# Patient Record
Sex: Female | Born: 1975 | Race: White | Hispanic: No | Marital: Married | Smoking: Former smoker
Health system: Southern US, Community
[De-identification: ages and names within clinical notes are randomized; demographics above are authoritative.]

## PROBLEM LIST (undated history)

## (undated) DIAGNOSIS — F32A Depression, unspecified: Secondary | ICD-10-CM

## (undated) DIAGNOSIS — R519 Headache, unspecified: Secondary | ICD-10-CM

## (undated) DIAGNOSIS — F419 Anxiety disorder, unspecified: Secondary | ICD-10-CM

## (undated) DIAGNOSIS — N200 Calculus of kidney: Secondary | ICD-10-CM

## (undated) DIAGNOSIS — K219 Gastro-esophageal reflux disease without esophagitis: Secondary | ICD-10-CM

## (undated) DIAGNOSIS — E785 Hyperlipidemia, unspecified: Secondary | ICD-10-CM

## (undated) DIAGNOSIS — J45909 Unspecified asthma, uncomplicated: Secondary | ICD-10-CM

## (undated) DIAGNOSIS — Z87442 Personal history of urinary calculi: Secondary | ICD-10-CM

## (undated) HISTORY — PX: WISDOM TOOTH EXTRACTION: SHX21

## (undated) HISTORY — DX: Hyperlipidemia, unspecified: E78.5

## (undated) HISTORY — PX: ABDOMINAL HYSTERECTOMY: SHX81

## (undated) HISTORY — PX: OTHER SURGICAL HISTORY: SHX169

## (undated) HISTORY — PX: TONSILLECTOMY: SUR1361

## (undated) HISTORY — DX: Unspecified asthma, uncomplicated: J45.909

## (undated) HISTORY — PX: APPENDECTOMY: SHX54

## (undated) HISTORY — DX: Anxiety disorder, unspecified: F41.9

## (undated) HISTORY — DX: Calculus of kidney: N20.0

## (undated) HISTORY — PX: ECTOPIC PREGNANCY SURGERY: SHX613

---

## 2021-01-11 LAB — HM MAMMOGRAPHY

## 2021-01-25 DIAGNOSIS — R5383 Other fatigue: Secondary | ICD-10-CM | POA: Insufficient documentation

## 2021-02-28 ENCOUNTER — Other Ambulatory Visit: Payer: Self-pay

## 2021-02-28 ENCOUNTER — Ambulatory Visit (INDEPENDENT_AMBULATORY_CARE_PROVIDER_SITE_OTHER): Payer: Managed Care, Other (non HMO) | Admitting: Family Medicine

## 2021-02-28 ENCOUNTER — Encounter: Payer: Self-pay | Admitting: Family Medicine

## 2021-02-28 VITALS — BP 110/78 | HR 96 | Temp 97.4°F | Resp 18 | Ht 64.0 in | Wt 221.0 lb

## 2021-02-28 DIAGNOSIS — M791 Myalgia, unspecified site: Secondary | ICD-10-CM

## 2021-02-28 DIAGNOSIS — G8929 Other chronic pain: Secondary | ICD-10-CM

## 2021-02-28 DIAGNOSIS — R519 Headache, unspecified: Secondary | ICD-10-CM

## 2021-02-28 DIAGNOSIS — F411 Generalized anxiety disorder: Secondary | ICD-10-CM

## 2021-02-28 DIAGNOSIS — M2559 Pain in other specified joint: Secondary | ICD-10-CM

## 2021-02-28 DIAGNOSIS — E782 Mixed hyperlipidemia: Secondary | ICD-10-CM

## 2021-02-28 DIAGNOSIS — R5383 Other fatigue: Secondary | ICD-10-CM

## 2021-02-28 NOTE — Progress Notes (Signed)
Subjective:  Patient ID: Kimberly Cabrera, female    DOB: 1975-10-30  Age: 45 y.o. MRN: [id number]  Chief Complaint  Patient presents with   New Patient (Initial Visit)    HPI Patient works for a [employer].) Has a lot of animals (rescue.) Lives in [place].  Hyperlipidemia: Has not had checked in a while.  Asthma: Seasonal. Not truly diagnosed.  History of kidney stones.  Has anxiety.   Pt was bitten by a tick last year. Pt was seen at urgent care 2 months after bite.  Pt began having fatigue, joint pain.  Positive for RMSF IGG. Given doxycycline x 14 days. Had a left anterior lymph node. Pt saw ENT at [facility]. She ordered an ultrasound. She saw Dr. Marvis Moeller. He ordered ct of neck, chest, abdomen, pelvis. Lymph nodes were all normal.   Now complaining of fatigue, joint pain, muscle pain, headaches. Waxes and wanes, but overall worsens. Tick bite was in left parietal region and area is still sore. Scheduled to see Rheumatology in [facility].    Current Outpatient Medications on File Prior to Visit  Medication Sig Dispense Refill   Aspirin-Acetaminophen (GOODYS BODY PAIN PO) Take by mouth in the morning and at bedtime.     FLUoxetine (PROZAC) 40 MG capsule Take by mouth.     LORazepam (ATIVAN) 0.5 MG tablet Take by mouth.     ondansetron (ZOFRAN-ODT) 4 MG disintegrating tablet Take by mouth.     No current facility-administered medications on file prior to visit.   Past Medical History:  Diagnosis Date   Anxiety    Asthma    Hyperlipidemia    Kidney stones    Past Surgical History:  Procedure Laterality Date   ABDOMINAL HYSTERECTOMY     45 yo. Total.   APPENDECTOMY     ECTOPIC PREGNANCY SURGERY N/A     Family History  Problem Relation Age of Onset   Hyperlipidemia Mother    Diabetes Mother    Hyperlipidemia Father    Diabetes Father    Heart disease Father    Dementia Father    Melanoma Father    Melanoma Sister    Diabetes Sister     Hypertension Maternal Grandmother    Mental illness Maternal Grandmother    Asthma Paternal Grandfather    Heart attack Paternal Grandfather    Arthritis Paternal Grandfather    Social History   Socioeconomic History   Marital status: Married    Spouse name: Not on file   Number of children: Not on file   Years of education: Not on file   Highest education level: Not on file  Occupational History   Not on file  Tobacco Use   Smoking status: Every Day    Packs/day: 0.50    Years: 5.00    Pack years: 2.50    Types: Cigarettes   Smokeless tobacco: Never  Substance and Sexual Activity   Alcohol use: Never   Drug use: Never   Sexual activity: Not on file  Other Topics Concern   Not on file  Social History Narrative   Not on file   Social Determinants of Health   Financial Resource Strain: Not on file  Food Insecurity: Not on file  Transportation Needs: Not on file  Physical Activity: Not on file  Stress: Not on file  Social Connections: Not on file    Review of Systems  Constitutional:  Positive for fatigue. Negative for chills and fever.  Night sweats   HENT:  Negative for congestion, ear pain, rhinorrhea and sore throat.   Respiratory:  Negative for cough and shortness of breath.   Cardiovascular:  Positive for chest pain (gets a pain mid chest for a second. no radiation. no dyspnea, nausea, vomiting, diaphoresis.).  Gastrointestinal:  Positive for abdominal pain (epigastric. 2-3 goodies per day.). Negative for constipation, diarrhea, nausea and vomiting.  Endocrine: Negative for polydipsia and polyphagia.  Genitourinary:  Negative for dysuria and urgency.  Musculoskeletal:  Positive for arthralgias (diffuse pain), back pain and myalgias.  Neurological:  Positive for headaches (3-4 times per week. Goodies help.). Negative for dizziness, weakness and light-headedness.  Psychiatric/Behavioral:  Positive for sleep disturbance (awakens at 3 m.). Negative for  dysphoric mood. The patient is nervous/anxious.     Objective:  BP 110/78   Pulse 96   Temp (!) 97.4 F (36.3 C)   Resp 18   Ht 5\' 4"  (1.626 m)   Wt 221 lb (100.2 kg)   BMI 37.93 kg/m   BP/Weight 02/28/2021  Systolic BP 110  Diastolic BP 78  Wt. (Lbs) 221  BMI 37.93    Physical Exam Vitals reviewed.  Constitutional:      General: She is not in acute distress.    Appearance: Normal appearance. She is normal weight.  HENT:     Right Ear: Tympanic membrane and ear canal normal.     Left Ear: Tympanic membrane and ear canal normal.     Nose: Nose normal. No congestion or rhinorrhea.  Neck:     Thyroid: No thyroid mass.     Vascular: No carotid bruit.  Cardiovascular:     Rate and Rhythm: Normal rate and regular rhythm.     Pulses: Normal pulses.     Heart sounds: Normal heart sounds. No murmur heard. Pulmonary:     Effort: Pulmonary effort is normal.     Breath sounds: Normal breath sounds.  Abdominal:     General: Bowel sounds are normal.     Palpations: Abdomen is soft. There is no mass.     Tenderness: There is no abdominal tenderness.  Musculoskeletal:        General: Normal range of motion.  Lymphadenopathy:     Cervical: No cervical adenopathy.  Skin:    General: Skin is warm and dry.  Neurological:     Mental Status: She is alert and oriented to person, place, and time.     Cranial Nerves: No cranial nerve deficit.  Psychiatric:        Mood and Affect: Mood normal.        Behavior: Behavior normal.    Diabetic Foot Exam - Simple   No data filed      No results found for: WBC, HGB, HCT, PLT, GLUCOSE, CHOL, TRIG, HDL, LDLDIRECT, LDLCALC, ALT, AST, NA, K, CL, CREATININE, BUN, CO2, TSH, PSA, INR, GLUF, HGBA1C, MICROALBUR    Assessment & Plan:   Problem List Items Addressed This Visit       Other   Fatigue - Primary    Await records and await rheumatology visit.      Joint pain    Await records and await rheumatology visit.      Muscle  pain    Await records and await rheumatology visit.      Chronic nonintractable headache    Await records and await rheumatology visit.      Relevant Medications   Aspirin-Acetaminophen (GOODYS BODY PAIN PO)  FLUoxetine (PROZAC) 40 MG capsule   Mixed hyperlipidemia    Follow up in 3 weeks fasting cpe.      GAD (generalized anxiety disorder)    Continue prozac 40 mg once daily and lorazepam 0.5 mg once daily prn anxiety.      Relevant Medications   FLUoxetine (PROZAC) 40 MG capsule   LORazepam (ATIVAN) 0.5 MG tablet  .  No orders of the defined types were placed in this encounter.   No orders of the defined types were placed in this encounter.    Follow-up: Return in about 3 weeks (around 03/21/2021) for chronic fasting.  An After Visit Summary was printed and given to the patient.  Blane Ohara, MD [facility] [phone]

## 2021-03-01 ENCOUNTER — Encounter: Payer: Self-pay | Admitting: Family Medicine

## 2021-03-04 ENCOUNTER — Other Ambulatory Visit: Payer: Self-pay | Admitting: Family Medicine

## 2021-03-04 DIAGNOSIS — R5383 Other fatigue: Secondary | ICD-10-CM

## 2021-03-04 DIAGNOSIS — M2559 Pain in other specified joint: Secondary | ICD-10-CM

## 2021-03-04 DIAGNOSIS — R519 Headache, unspecified: Secondary | ICD-10-CM

## 2021-03-04 DIAGNOSIS — M791 Myalgia, unspecified site: Secondary | ICD-10-CM

## 2021-03-11 ENCOUNTER — Encounter: Payer: Self-pay | Admitting: Family Medicine

## 2021-03-11 DIAGNOSIS — M791 Myalgia, unspecified site: Secondary | ICD-10-CM | POA: Insufficient documentation

## 2021-03-11 DIAGNOSIS — M2550 Pain in unspecified joint: Secondary | ICD-10-CM | POA: Insufficient documentation

## 2021-03-11 DIAGNOSIS — R519 Headache, unspecified: Secondary | ICD-10-CM | POA: Insufficient documentation

## 2021-03-11 DIAGNOSIS — G8929 Other chronic pain: Secondary | ICD-10-CM | POA: Insufficient documentation

## 2021-03-12 ENCOUNTER — Encounter: Payer: Self-pay | Admitting: Family Medicine

## 2021-03-18 ENCOUNTER — Encounter: Payer: Self-pay | Admitting: Family Medicine

## 2021-03-18 DIAGNOSIS — E1169 Type 2 diabetes mellitus with other specified complication: Secondary | ICD-10-CM | POA: Insufficient documentation

## 2021-03-18 DIAGNOSIS — E782 Mixed hyperlipidemia: Secondary | ICD-10-CM | POA: Insufficient documentation

## 2021-03-18 DIAGNOSIS — F411 Generalized anxiety disorder: Secondary | ICD-10-CM | POA: Insufficient documentation

## 2021-03-18 DIAGNOSIS — E1165 Type 2 diabetes mellitus with hyperglycemia: Secondary | ICD-10-CM | POA: Insufficient documentation

## 2021-03-18 NOTE — Assessment & Plan Note (Signed)
Await records and await rheumatology visit. 

## 2021-03-18 NOTE — Assessment & Plan Note (Signed)
Follow up in 3 weeks fasting cpe.

## 2021-03-18 NOTE — Assessment & Plan Note (Signed)
Continue prozac 40 mg once daily and lorazepam 0.5 mg once daily prn anxiety.

## 2021-03-18 NOTE — Assessment & Plan Note (Signed)
Await records and await rheumatology visit.

## 2021-03-18 NOTE — Assessment & Plan Note (Signed)
>>  ASSESSMENT AND PLAN FOR MIXED HYPERLIPIDEMIA WRITTEN ON 03/18/2021 10:26 PM BY Chiante Peden, MD  Follow up in 3 weeks fasting cpe.

## 2021-03-20 NOTE — Progress Notes (Signed)
Subjective:  Patient ID: Kimberly Cabrera, female    DOB: [birth year]  Age: 45 y.o. MRN: [id number]  Chief Complaint  Patient presents with   Annual Exam    HPI Kimberly Cabrera is a 45 year old Caucasian female that presents for annual physical exam Encounter for general adult medical examination without abnormal findings  Physical ("At Risk" items are starred): Patient's last physical exam was 1 year ago .      SDOH Screenings   Alcohol Screen: Not on file  Depression (PHQ2-9): Low Risk    PHQ-2 Score: 0  Financial Resource Strain: Not on file  Food Insecurity: Not on file  Housing: Not on file  Physical Activity: Not on file  Social Connections: Not on file  Stress: Not on file  Tobacco Use: High Risk   Smoking Tobacco Use: Every Day   Smokeless Tobacco Use: Never   Passive Exposure: Not on file  Transportation Needs: Not on file    Fall Risk  03/21/2021  Falls in the past year? 0  Number falls in past yr: 0  Injury with Fall? 0  Risk for fall due to : No Fall Risks  Follow up Falls evaluation completed    Depression screen [facility] 2/9 03/21/2021  Decreased Interest 0  Down, Depressed, Hopeless 0  PHQ - 2 Score 0    Functional Status Survey:     Safety: reviewed ;  Patient does not wear a seat belt. Patient's home has smoke detectors. Patient practices appropriate gun safety Patient wears sunscreen with extended sun exposure. Dental Care: biannual cleanings, brushes and flosses daily. Ophthalmology/Optometry: Annual visit. Overdue Hearing loss: none Vision impairments: none Patient is not afflicted from Stress Incontinence and Urge Incontinence   Current Outpatient Medications on File Prior to Visit  Medication Sig Dispense Refill   Aspirin-Acetaminophen (GOODYS BODY PAIN PO) Take by mouth in the morning and at bedtime.     FLUoxetine (PROZAC) 40 MG capsule Take by mouth.     LORazepam (ATIVAN) 0.5 MG tablet Take by mouth.     No current facility-administered  medications on file prior to visit.    Social Hx   Social History   Socioeconomic History   Marital status: Married    Spouse name: Not on file   Number of children: Not on file   Years of education: Not on file   Highest education level: Not on file  Occupational History   Not on file  Tobacco Use   Smoking status: Every Day    Packs/day: 0.50    Years: 5.00    Pack years: 2.50    Types: Cigarettes   Smokeless tobacco: Never  Substance and Sexual Activity   Alcohol use: Never   Drug use: Never   Sexual activity: Not on file  Other Topics Concern   Not on file  Social History Narrative   Not on file   Social Determinants of Health   Financial Resource Strain: Not on file  Food Insecurity: Not on file  Transportation Needs: Not on file  Physical Activity: Not on file  Stress: Not on file  Social Connections: Not on file   Past Medical History:  Diagnosis Date   Anxiety    Asthma    Hyperlipidemia    Kidney stones    Family History  Problem Relation Age of Onset   Hyperlipidemia Mother    Diabetes Mother    Hyperlipidemia Father    Diabetes Father    Heart disease Father  Dementia Father    Melanoma Father    Melanoma Sister    Diabetes Sister    Hypertension Maternal Grandmother    Mental illness Maternal Grandmother    Asthma Paternal Grandfather    Heart attack Paternal Grandfather    Arthritis Paternal Grandfather     Review of Systems  Constitutional:  Negative for chills, fatigue and fever.  HENT:  Negative for congestion, ear pain, rhinorrhea and sore throat.   Eyes: Negative.   Respiratory:  Negative for cough and shortness of breath.   Cardiovascular:  Negative for chest pain.  Gastrointestinal:  Negative for abdominal pain, constipation, diarrhea, nausea and vomiting.       GERD, abd bloating  Genitourinary:  Negative for dysuria and urgency.  Musculoskeletal:  Positive for arthralgias, back pain and myalgias.  Allergic/Immunologic:  Negative.   Neurological:  Positive for headaches. Negative for dizziness and light-headedness.  Psychiatric/Behavioral:  Negative for dysphoric mood. The patient is not nervous/anxious.     Objective:  BP 118/74   Pulse 89   Temp (!) 96.2 F (35.7 C)   Ht 5\' 3"  (1.6 m)   Wt 220 lb (99.8 kg)   SpO2 98%   BMI 38.97 kg/m   BP/Weight 03/21/2021 02/28/2021  Systolic BP 118 110  Diastolic BP 74 78  Wt. (Lbs) 220 221  BMI 38.97 37.93    Physical Exam Vitals reviewed.  Constitutional:      Appearance: Normal appearance.  HENT:     Right Ear: Tympanic membrane normal.     Left Ear: Tympanic membrane normal.     Nose: Nose normal.     Mouth/Throat:     Pharynx: No oropharyngeal exudate or posterior oropharyngeal erythema.  Eyes:     Conjunctiva/sclera: Conjunctivae normal.  Neck:     Vascular: No carotid bruit.  Cardiovascular:     Rate and Rhythm: Normal rate and regular rhythm.     Pulses: Normal pulses.     Heart sounds: Normal heart sounds.  Pulmonary:     Effort: Pulmonary effort is normal.     Breath sounds: Normal breath sounds.  Abdominal:     General: Bowel sounds are normal.     Palpations: There is no mass.     Tenderness: There is no abdominal tenderness.  Musculoskeletal:     Cervical back: Normal range of motion.  Skin:    Findings: No lesion.  Neurological:     Mental Status: She is alert and oriented to person, place, and time.  Psychiatric:        Mood and Affect: Mood normal.        Behavior: Behavior normal.        Assessment & Plan:     1. Routine medical exam - CBC with Differential/Platelet - Comprehensive metabolic panel - Lipid panel - TSH - VITAMIN D 25 Hydroxy (Vit-D Deficiency, Fractures) - B12 and Folate Panel  2. Screening for colon cancer - Ambulatory referral to Gastroenterology  3. Adult BMI 38.0-38.9 kg/sq m  4. Cigarette nicotine dependence with other nicotine-induced disorder   -recommend smoking  cessation   These are the goals we discussed: "To figure out what's going on with me"    This is a list of the screening recommended for you and due dates:  Health Maintenance  Topic Date Due   Tetanus Vaccine  03/21/2022*   HPV Vaccine  Aged Out   Pneumococcal Vaccination  Discontinued   Flu Shot  Discontinued   Pap  Smear  Discontinued   COVID-19 Vaccine  Discontinued   Hepatitis C Screening: USPSTF Recommendation to screen - Ages 64-79 yo.  Discontinued   HIV Screening  Discontinued  *Topic was postponed. The date shown is not the original due date.      AN INDIVIDUALIZED CARE PLAN: was established or reinforced today.   SELF MANAGEMENT: The patient and I together assessed ways to personally work towards obtaining the recommended goals  Support needs The patient and/or family needs were assessed and services were offered and not necessary at this time.    Follow-up: 04/02/21 with Dr Sedalia Muta at 10:15  I, Janie Morning, NP, have reviewed all documentation for this visit. The documentation on 03/21/21 for the exam, diagnosis, procedures, and orders are all accurate and complete.   Signed, Flonnie Hailstone, DNP [facility] [phone]

## 2021-03-21 ENCOUNTER — Ambulatory Visit (INDEPENDENT_AMBULATORY_CARE_PROVIDER_SITE_OTHER): Payer: Managed Care, Other (non HMO) | Admitting: Nurse Practitioner

## 2021-03-21 ENCOUNTER — Encounter: Payer: Self-pay | Admitting: Nurse Practitioner

## 2021-03-21 ENCOUNTER — Other Ambulatory Visit: Payer: Self-pay

## 2021-03-21 VITALS — BP 118/74 | HR 89 | Temp 96.2°F | Ht 63.0 in | Wt 220.0 lb

## 2021-03-21 DIAGNOSIS — Z6838 Body mass index (BMI) 38.0-38.9, adult: Secondary | ICD-10-CM

## 2021-03-21 DIAGNOSIS — Z1211 Encounter for screening for malignant neoplasm of colon: Secondary | ICD-10-CM | POA: Diagnosis not present

## 2021-03-21 DIAGNOSIS — Z0000 Encounter for general adult medical examination without abnormal findings: Secondary | ICD-10-CM | POA: Diagnosis not present

## 2021-03-21 DIAGNOSIS — F17218 Nicotine dependence, cigarettes, with other nicotine-induced disorders: Secondary | ICD-10-CM | POA: Diagnosis not present

## 2021-03-21 NOTE — Patient Instructions (Addendum)
We will call you with lab results and GI referral appointment Keep follow-up appointment with Dr Tobie Poet 04/02/21 as scheduled Recommend screening eye exam  Preventive Care 59-43 Years Old, Female Preventive care refers to lifestyle choices and visits with your health care provider that can promote health and wellness. This includes: A yearly physical exam. This is also called an annual wellness visit. Regular dental and eye exams. Immunizations. Screening for certain conditions. Healthy lifestyle choices, such as: Eating a healthy diet. Getting regular exercise. Not using drugs or products that contain nicotine and tobacco. Limiting alcohol use. What can I expect for my preventive care visit? Physical exam Your health care provider will check your: Height and weight. These may be used to calculate your BMI (body mass index). BMI is a measurement that tells if you are at a healthy weight. Heart rate and blood pressure. Body temperature. Skin for abnormal spots. Counseling Your health care provider may ask you questions about your: Past medical problems. Family's medical history. Alcohol, tobacco, and drug use. Emotional well-being. Home life and relationship well-being. Sexual activity. Diet, exercise, and sleep habits. Work and work Statistician. Access to firearms. Method of birth control. Menstrual cycle. Pregnancy history. What immunizations do I need? Vaccines are usually given at various ages, according to a schedule. Your health care provider will recommend vaccines for you based on your age, medical history, and lifestyle or other factors, such as travel or where you work. What tests do I need? Blood tests Lipid and cholesterol levels. These may be checked every 5 years, or more often if you are over 27 years old. Hepatitis C test. Hepatitis B test. Screening Lung cancer screening. You may have this screening every year starting at age 6 if you have a 30-pack-year  history of smoking and currently smoke or have quit within the past 15 years. Colorectal cancer screening. All adults should have this screening starting at age 30 and continuing until age 6. Your health care provider may recommend screening at age 72 if you are at increased risk. You will have tests every 1-10 years, depending on your results and the type of screening test. Diabetes screening. This is done by checking your blood sugar (glucose) after you have not eaten for a while (fasting). You may have this done every 1-3 years. Mammogram. This may be done every 1-2 years. Talk with your health care provider about when you should start having regular mammograms. This may depend on whether you have a family history of breast cancer. BRCA-related cancer screening. This may be done if you have a family history of breast, ovarian, tubal, or peritoneal cancers. Pelvic exam and Pap test. This may be done every 3 years starting at age 72. Starting at age 104, this may be done every 5 years if you have a Pap test in combination with an HPV test. Other tests STD (sexually transmitted disease) testing, if you are at risk. Bone density scan. This is done to screen for osteoporosis. You may have this scan if you are at high risk for osteoporosis. Talk with your health care provider about your test results, treatment options, and if necessary, the need for more tests. Follow these instructions at home: Eating and drinking  Eat a diet that includes fresh fruits and vegetables, whole grains, lean protein, and low-fat dairy products. Take vitamin and mineral supplements as recommended by your health care provider. Do not drink alcohol if: Your health care provider tells you not to drink. You are  pregnant, may be pregnant, or are planning to become pregnant. If you drink alcohol: Limit how much you have to 0-1 drink a day. Be aware of how much alcohol is in your drink. In the [place] one drink equals  one 12 oz bottle of beer (355 mL), one 5 oz glass of wine (148 mL), or one 1 oz glass of hard liquor (44 mL). Lifestyle Take daily care of your teeth and gums. Brush your teeth every morning and night with fluoride toothpaste. Floss one time each day. Stay active. Exercise for at least 30 minutes 5 or more days each week. Do not use any products that contain nicotine or tobacco, such as cigarettes, e-cigarettes, and chewing tobacco. If you need help quitting, ask your health care provider. Do not use drugs. If you are sexually active, practice safe sex. Use a condom or other form of protection to prevent STIs (sexually transmitted infections). If you do not wish to become pregnant, use a form of birth control. If you plan to become pregnant, see your health care provider for a prepregnancy visit. If told by your health care provider, take low-dose aspirin daily starting at age 13. Find healthy ways to cope with stress, such as: Meditation, yoga, or listening to music. Journaling. Talking to a trusted person. Spending time with friends and family. Safety Always wear your seat belt while driving or riding in a vehicle. Do not drive: If you have been drinking alcohol. Do not ride with someone who has been drinking. When you are tired or distracted. While texting. Wear a helmet and other protective equipment during sports activities. If you have firearms in your house, make sure you follow all gun safety procedures. What's next? Visit your health care provider once a year for an annual wellness visit. Ask your health care provider how often you should have your eyes and teeth checked. Stay up to date on all vaccines. This information is not intended to replace advice given to you by your health care provider. Make sure you discuss any questions you have with your health care provider. Document Revised: 07/21/2020 Document Reviewed: 01/22/2018 Elsevier Patient Education  [place] Maintenance, Female Adopting a healthy lifestyle and getting preventive care are important in promoting health and wellness. Ask your health care provider about: The right schedule for you to have regular tests and exams. Things you can do on your own to prevent diseases and keep yourself healthy. What should I know about diet, weight, and exercise? Eat a healthy diet  Eat a diet that includes plenty of vegetables, fruits, low-fat dairy products, and lean protein. Do not eat a lot of foods that are high in solid fats, added sugars, or sodium. Maintain a healthy weight Body mass index (BMI) is used to identify weight problems. It estimates body fat based on height and weight. Your health care provider can help determine your BMI and help you achieve or maintain a healthy weight. Get regular exercise Get regular exercise. This is one of the most important things you can do for your health. Most adults should: Exercise for at least 150 minutes each week. The exercise should increase your heart rate and make you sweat (moderate-intensity exercise). Do strengthening exercises at least twice a week. This is in addition to the moderate-intensity exercise. Spend less time sitting. Even light physical activity can be beneficial. Watch cholesterol and blood lipids Have your blood tested for lipids and cholesterol at 43 years of age, then have  this test every 5 years. Have your cholesterol levels checked more often if: Your lipid or cholesterol levels are high. You are older than 43 years of age. You are at high risk for heart disease. What should I know about cancer screening? Depending on your health history and family history, you may need to have cancer screening at various ages. This may include screening for: Breast cancer. Cervical cancer. Colorectal cancer. Skin cancer. Lung cancer. What should I know about heart disease, diabetes, and high blood pressure? Blood pressure and  heart disease High blood pressure causes heart disease and increases the risk of stroke. This is more likely to develop in people who have high blood pressure readings, are of African descent, or are overweight. Have your blood pressure checked: Every 3-5 years if you are 76-20 years of age. Every year if you are 4 years old or older. Diabetes Have regular diabetes screenings. This checks your fasting blood sugar level. Have the screening done: Once every three years after age 62 if you are at a normal weight and have a low risk for diabetes. More often and at a younger age if you are overweight or have a high risk for diabetes. What should I know about preventing infection? Hepatitis B If you have a higher risk for hepatitis B, you should be screened for this virus. Talk with your health care provider to find out if you are at risk for hepatitis B infection. Hepatitis C Testing is recommended for: Everyone born from [birth year] through 1963. Anyone with known risk factors for hepatitis C. Sexually transmitted infections (STIs) Get screened for STIs, including gonorrhea and chlamydia, if: You are sexually active and are younger than 43 years of age. You are older than 43 years of age and your health care provider tells you that you are at risk for this type of infection. Your sexual activity has changed since you were last screened, and you are at increased risk for chlamydia or gonorrhea. Ask your health care provider if you are at risk. Ask your health care provider about whether you are at high risk for HIV. Your health care provider may recommend a prescription medicine to help prevent HIV infection. If you choose to take medicine to prevent HIV, you should first get tested for HIV. You should then be tested every 3 months for as long as you are taking the medicine. Pregnancy If you are about to stop having your period (premenopausal) and you may become pregnant, seek counseling before you get  pregnant. Take 400 to 800 micrograms (mcg) of folic acid every day if you become pregnant. Ask for birth control (contraception) if you want to prevent pregnancy. Osteoporosis and menopause Osteoporosis is a disease in which the bones lose minerals and strength with aging. This can result in bone fractures. If you are 56 years old or older, or if you are at risk for osteoporosis and fractures, ask your health care provider if you should: Be screened for bone loss. Take a calcium or vitamin D supplement to lower your risk of fractures. Be given hormone replacement therapy (HRT) to treat symptoms of menopause. Follow these instructions at home: Lifestyle Do not use any products that contain nicotine or tobacco, such as cigarettes, e-cigarettes, and chewing tobacco. If you need help quitting, ask your health care provider. Do not use street drugs. Do not share needles. Ask your health care provider for help if you need support or information about quitting drugs. Alcohol use Do not  drink alcohol if: Your health care provider tells you not to drink. You are pregnant, may be pregnant, or are planning to become pregnant. If you drink alcohol: Limit how much you use to 0-1 drink a day. Limit intake if you are breastfeeding. Be aware of how much alcohol is in your drink. In the [place] one drink equals one 12 oz bottle of beer (355 mL), one 5 oz glass of wine (148 mL), or one 1 oz glass of hard liquor (44 mL). General instructions Schedule regular health, dental, and eye exams. Stay current with your vaccines. Tell your health care provider if: You often feel depressed. You have ever been abused or do not feel safe at home. Summary Adopting a healthy lifestyle and getting preventive care are important in promoting health and wellness. Follow your health care provider's instructions about healthy diet, exercising, and getting tested or screened for diseases. Follow your health care provider's  instructions on monitoring your cholesterol and blood pressure. This information is not intended to replace advice given to you by your health care provider. Make sure you discuss any questions you have with your health care provider. Document Revised: 07/21/2020 Document Reviewed: 05/06/2018 Elsevier Patient Education  2022 [employer].

## 2021-03-22 ENCOUNTER — Other Ambulatory Visit: Payer: Self-pay | Admitting: Nurse Practitioner

## 2021-03-22 DIAGNOSIS — E782 Mixed hyperlipidemia: Secondary | ICD-10-CM

## 2021-03-22 DIAGNOSIS — E781 Pure hyperglyceridemia: Secondary | ICD-10-CM

## 2021-03-22 DIAGNOSIS — E559 Vitamin D deficiency, unspecified: Secondary | ICD-10-CM

## 2021-03-22 LAB — CBC WITH DIFFERENTIAL/PLATELET
Basophils Absolute: 0.1 10*3/uL (ref 0.0–0.2)
Basos: 1 %
EOS (ABSOLUTE): 0.1 10*3/uL (ref 0.0–0.4)
Eos: 2 %
Hematocrit: 44 % (ref 34.0–46.6)
Hemoglobin: 14.7 g/dL (ref 11.1–15.9)
Immature Grans (Abs): 0 10*3/uL (ref 0.0–0.1)
Immature Granulocytes: 1 %
Lymphocytes Absolute: 2.4 10*3/uL (ref 0.7–3.1)
Lymphs: 38 %
MCH: 28.8 pg (ref 26.6–33.0)
MCHC: 33.4 g/dL (ref 31.5–35.7)
MCV: 86 fL (ref 79–97)
Monocytes Absolute: 0.5 10*3/uL (ref 0.1–0.9)
Monocytes: 8 %
Neutrophils Absolute: 3.1 10*3/uL (ref 1.4–7.0)
Neutrophils: 50 %
Platelets: 276 10*3/uL (ref 150–450)
RBC: 5.11 x10E6/uL (ref 3.77–5.28)
RDW: 11.9 % (ref 11.7–15.4)
WBC: 6.2 10*3/uL (ref 3.4–10.8)

## 2021-03-22 LAB — COMPREHENSIVE METABOLIC PANEL
ALT: 37 IU/L — ABNORMAL HIGH (ref 0–32)
AST: 29 IU/L (ref 0–40)
Albumin/Globulin Ratio: 2 (ref 1.2–2.2)
Albumin: 4.7 g/dL (ref 3.8–4.8)
Alkaline Phosphatase: 92 IU/L (ref 44–121)
BUN/Creatinine Ratio: 9 (ref 9–23)
BUN: 7 mg/dL (ref 6–24)
Bilirubin Total: 0.3 mg/dL (ref 0.0–1.2)
CO2: 23 mmol/L (ref 20–29)
Calcium: 9.8 mg/dL (ref 8.7–10.2)
Chloride: 102 mmol/L (ref 96–106)
Creatinine, Ser: 0.82 mg/dL (ref 0.57–1.00)
Globulin, Total: 2.4 g/dL (ref 1.5–4.5)
Glucose: 105 mg/dL — ABNORMAL HIGH (ref 70–99)
Potassium: 4.6 mmol/L (ref 3.5–5.2)
Sodium: 141 mmol/L (ref 134–144)
Total Protein: 7.1 g/dL (ref 6.0–8.5)
eGFR: 91 mL/min/{1.73_m2} (ref >59)

## 2021-03-22 LAB — LIPID PANEL
Chol/HDL Ratio: 7.8 ratio — ABNORMAL HIGH (ref 0.0–4.4)
Cholesterol, Total: 273 mg/dL — ABNORMAL HIGH (ref 100–199)
HDL: 35 mg/dL — ABNORMAL LOW (ref >39)
LDL Chol Calc (NIH): 186 mg/dL — ABNORMAL HIGH (ref 0–99)
Triglycerides: 265 mg/dL — ABNORMAL HIGH (ref 0–149)
VLDL Cholesterol Cal: 52 mg/dL — ABNORMAL HIGH (ref 5–40)

## 2021-03-22 LAB — VITAMIN D 25 HYDROXY (VIT D DEFICIENCY, FRACTURES): Vit D, 25-Hydroxy: 13.2 ng/mL — ABNORMAL LOW (ref 30.0–100.0)

## 2021-03-22 LAB — TSH: TSH: 2.14 u[IU]/mL (ref 0.450–4.500)

## 2021-03-22 LAB — CARDIOVASCULAR RISK ASSESSMENT

## 2021-03-22 LAB — B12 AND FOLATE PANEL
Folate: 12.3 ng/mL (ref >3.0)
Vitamin B-12: 543 pg/mL (ref 232–1245)

## 2021-03-22 MED ORDER — ROSUVASTATIN CALCIUM 20 MG PO TABS: 20.0000 mg | ORAL_TABLET | Freq: Every day | ORAL | 3 refills | Status: DC

## 2021-03-22 MED ORDER — VITAMIN D (ERGOCALCIFEROL) 1.25 MG (50000 UNIT) PO CAPS: 50000.0000 [IU] | ORAL_CAPSULE | ORAL | 2 refills | Status: DC

## 2021-03-22 MED ORDER — ICOSAPENT ETHYL 1 G PO CAPS: 2.0000 g | ORAL_CAPSULE | Freq: Two times a day (BID) | ORAL | 2 refills | Status: DC

## 2021-03-23 ENCOUNTER — Other Ambulatory Visit: Payer: Self-pay

## 2021-03-29 ENCOUNTER — Ambulatory Visit: Payer: Managed Care, Other (non HMO) | Admitting: Family Medicine

## 2021-04-02 ENCOUNTER — Ambulatory Visit (INDEPENDENT_AMBULATORY_CARE_PROVIDER_SITE_OTHER): Payer: Managed Care, Other (non HMO) | Admitting: Family Medicine

## 2021-04-02 ENCOUNTER — Telehealth: Payer: Self-pay | Admitting: Family Medicine

## 2021-04-02 ENCOUNTER — Other Ambulatory Visit: Payer: Self-pay

## 2021-04-02 VITALS — BP 110/76 | HR 84 | Temp 98.4°F | Resp 16 | Ht 63.0 in | Wt 220.0 lb

## 2021-04-02 DIAGNOSIS — R10811 Right upper quadrant abdominal tenderness: Secondary | ICD-10-CM | POA: Diagnosis not present

## 2021-04-02 DIAGNOSIS — G8929 Other chronic pain: Secondary | ICD-10-CM

## 2021-04-02 DIAGNOSIS — E782 Mixed hyperlipidemia: Secondary | ICD-10-CM

## 2021-04-02 DIAGNOSIS — R519 Headache, unspecified: Secondary | ICD-10-CM

## 2021-04-02 DIAGNOSIS — E559 Vitamin D deficiency, unspecified: Secondary | ICD-10-CM

## 2021-04-02 MED ORDER — ROSUVASTATIN CALCIUM 5 MG PO TABS: 5.0000 mg | ORAL_TABLET | Freq: Every day | ORAL | 0 refills | Status: DC

## 2021-04-02 NOTE — Patient Instructions (Signed)
High cholesterol: Start crestor 5 mg once daily at night.  Low fat diet.   RUQ abdominal tenderness: GB US.

## 2021-04-02 NOTE — Telephone Encounter (Signed)
   Kimberly Cabrera has been scheduled for the following appointment:  WHAT: GB/RUQ ULTRASOUND WHERE: [facility] DATE: 04/05/21 TIME: 7:30 AM ARRIVAL TIME  Patient has been made aware.

## 2021-04-02 NOTE — Progress Notes (Signed)
Subjective:  Patient ID: Kimberly Cabrera, female    DOB: 1976-02-24  Age: 45 y.o. MRN: [id number]  Chief Complaint  Patient presents with   Hyperlipidemia   HPI Hyperlipidemia: elevated LDL 186 and triglycerides 810. Pt would like to work on diet. Unable to exercise because she does not feel well. She has not picked up vascepa or crestor. She is scared of taking these because she does not feel well. Has changed diet.  Rheumatologist does not feel her issues are rheumatology.   Vitamin D very low. Recommend start on vitamin D 17510 U weekly. She is picking up.   Has appt with neurology with 05/29/2021 with Dr. Epimenio Foot.  Headaches: tried to stop goodies (twice a day), but had a severe rebound headache.   Current Outpatient Medications on File Prior to Visit  Medication Sig Dispense Refill   Aspirin-Acetaminophen (GOODYS BODY PAIN PO) Take by mouth in the morning and at bedtime.     FLUoxetine (PROZAC) 40 MG capsule Take by mouth.     icosapent Ethyl (VASCEPA) 1 g capsule Take 2 capsules (2 g total) by mouth 2 (two) times daily. (Patient not taking: Reported on 04/02/2021) 120 capsule 2   LORazepam (ATIVAN) 0.5 MG tablet Take by mouth.     Vitamin D, Ergocalciferol, (DRISDOL) 1.25 MG (50000 UNIT) CAPS capsule Take 1 capsule (50,000 Units total) by mouth every 7 (seven) days. 5 capsule 2   No current facility-administered medications on file prior to visit.   Past Medical History:  Diagnosis Date   Anxiety    Asthma    Hyperlipidemia    Kidney stones    Past Surgical History:  Procedure Laterality Date   ABDOMINAL HYSTERECTOMY     45 yo. Total.   APPENDECTOMY     ECTOPIC PREGNANCY SURGERY N/A     Family History  Problem Relation Age of Onset   Hyperlipidemia Mother    Diabetes Mother    Hyperlipidemia Father    Diabetes Father    Heart disease Father    Dementia Father    Melanoma Father    Melanoma Sister    Diabetes Sister    Hypertension Maternal Grandmother    Mental  illness Maternal Grandmother    Asthma Paternal Grandfather    Heart attack Paternal Grandfather    Arthritis Paternal Grandfather    Social History   Socioeconomic History   Marital status: Married    Spouse name: Laconya Clere   Number of children: Not on file   Years of education: Not on file   Highest education level: Not on file  Occupational History   Occupation: Charity fundraiser    Comment: [employer] ([place]  Tobacco Use   Smoking status: Every Day    Packs/day: 0.50    Years: 5.00    Pack years: 2.50    Types: Cigarettes   Smokeless tobacco: Never   Tobacco comments:    Smokes 2-3 cigarettes a day  Substance and Sexual Activity   Alcohol use: Never   Drug use: Never   Sexual activity: Not Currently    Partners: Male  Other Topics Concern   Not on file  Social History Narrative   Not on file   Social Determinants of Health   Financial Resource Strain: Not on file  Food Insecurity: Not on file  Transportation Needs: Not on file  Physical Activity: Not on file  Stress: Not on file  Social Connections: Not on file    Review of Systems  Constitutional:  Positive for fatigue. Negative for chills and fever.  HENT:  Positive for ear pain (left earache). Negative for congestion, rhinorrhea and sore throat.   Respiratory:  Negative for cough and shortness of breath.   Cardiovascular:  Negative for chest pain.  Gastrointestinal:  Positive for abdominal pain (EGD and colonoscopy scheduled for December 19 with Dr. Maryruth Bun.). Negative for constipation, diarrhea, nausea and vomiting.  Genitourinary:  Negative for dysuria and urgency.  Musculoskeletal:  Positive for arthralgias, back pain and myalgias.  Neurological:  Positive for headaches. Negative for dizziness, weakness and light-headedness.  Psychiatric/Behavioral:  Negative for dysphoric mood. The patient is not nervous/anxious.     Objective:  BP 110/76   Pulse 84   Temp 98.4 F (36.9 C)   Resp 16   Ht 5\' 3"  (1.6  m)   Wt 220 lb (99.8 kg)   BMI 38.97 kg/m   BP/Weight 04/02/2021 03/21/2021 02/28/2021  Systolic BP 110 118 110  Diastolic BP 76 74 78  Wt. (Lbs) 220 220 221  BMI 38.97 38.97 37.93    Physical Exam Vitals reviewed.  Constitutional:      Appearance: Normal appearance. She is obese.  Cardiovascular:     Rate and Rhythm: Normal rate and regular rhythm.     Heart sounds: Normal heart sounds.  Pulmonary:     Effort: Pulmonary effort is normal. No respiratory distress.     Breath sounds: Normal breath sounds.  Abdominal:     General: Abdomen is flat. Bowel sounds are normal.     Palpations: Abdomen is soft.     Tenderness: There is abdominal tenderness (RUQ).  Neurological:     Mental Status: She is alert and oriented to person, place, and time.  Psychiatric:        Mood and Affect: Mood normal.        Behavior: Behavior normal.    Diabetic Foot Exam - Simple   No data filed      Lab Results  Component Value Date   WBC 6.2 03/21/2021   HGB 14.7 03/21/2021   HCT 44.0 03/21/2021   PLT 276 03/21/2021   GLUCOSE 105 (H) 03/21/2021   CHOL 273 (H) 03/21/2021   TRIG 265 (H) 03/21/2021   HDL 35 (L) 03/21/2021   LDLCALC 186 (H) 03/21/2021   ALT 37 (H) 03/21/2021   AST 29 03/21/2021   NA 141 03/21/2021   K 4.6 03/21/2021   CL 102 03/21/2021   CREATININE 0.82 03/21/2021   BUN 7 03/21/2021   CO2 23 03/21/2021   TSH 2.140 03/21/2021      Assessment & Plan:   Problem List Items Addressed This Visit       Other   Chronic nonintractable headache    Recommend gradually decrease goodies.      Mild vitamin D deficiency    Start vitamin D.       Right upper quadrant abdominal tenderness without rebound tenderness - Primary   Relevant Orders   03/23/2021 Abdomen Limited RUQ (LIVER/GB) (Completed)   Mixed hyperlipidemia   Relevant Medications   rosuvastatin (CRESTOR) 5 MG tablet  .  Meds ordered this encounter  Medications   rosuvastatin (CRESTOR) 5 MG tablet    Sig:  Take 1 tablet (5 mg total) by mouth daily.    Dispense:  90 tablet    Refill:  0    Orders Placed This Encounter  Procedures   US Abdomen Limited RUQ (LIVER/GB)     Follow-up:  Return in about 3 months (around 06/28/2021) for chronic fasting.  An After Visit Summary was printed and given to the patient.  Blane Ohara, MD [facility] [phone]

## 2021-04-10 ENCOUNTER — Other Ambulatory Visit: Payer: Self-pay

## 2021-04-10 ENCOUNTER — Telehealth: Payer: Self-pay

## 2021-04-10 DIAGNOSIS — R10811 Right upper quadrant abdominal tenderness: Secondary | ICD-10-CM

## 2021-04-10 NOTE — Telephone Encounter (Signed)
I gave the results for RUQ ultrasound. Patient is agreed to do billiary scan. Can you please order it?

## 2021-04-11 ENCOUNTER — Other Ambulatory Visit: Payer: Self-pay | Admitting: Family Medicine

## 2021-04-11 DIAGNOSIS — R10811 Right upper quadrant abdominal tenderness: Secondary | ICD-10-CM

## 2021-04-11 NOTE — Telephone Encounter (Signed)
Done. kc 

## 2021-04-13 ENCOUNTER — Telehealth: Payer: Self-pay | Admitting: Family Medicine

## 2021-04-13 NOTE — Telephone Encounter (Signed)
   Kimberly Cabrera has been scheduled for the following appointment:  WHAT: BILARY/HIDA SCAN WHERE: [facility] DATE: 04/24/21 TIME: 7:30 AM ARRIVAL TIME  Patient has been made aware. NPO AFTER MIDNIGHT

## 2021-04-21 ENCOUNTER — Encounter: Payer: Self-pay | Admitting: Family Medicine

## 2021-04-21 DIAGNOSIS — E559 Vitamin D deficiency, unspecified: Secondary | ICD-10-CM | POA: Insufficient documentation

## 2021-04-21 NOTE — Assessment & Plan Note (Signed)
Start vitamin D

## 2021-04-21 NOTE — Assessment & Plan Note (Signed)
Recommend gradually decrease goodies.

## 2021-05-01 ENCOUNTER — Other Ambulatory Visit: Payer: Self-pay

## 2021-05-01 DIAGNOSIS — R10811 Right upper quadrant abdominal tenderness: Secondary | ICD-10-CM

## 2021-05-29 ENCOUNTER — Ambulatory Visit (INDEPENDENT_AMBULATORY_CARE_PROVIDER_SITE_OTHER): Payer: No Typology Code available for payment source | Admitting: Neurology

## 2021-05-29 ENCOUNTER — Other Ambulatory Visit: Payer: Self-pay

## 2021-05-29 ENCOUNTER — Encounter: Payer: Self-pay | Admitting: Neurology

## 2021-05-29 VITALS — BP 124/87 | HR 99 | Ht 63.0 in | Wt 209.0 lb

## 2021-05-29 DIAGNOSIS — G8929 Other chronic pain: Secondary | ICD-10-CM | POA: Diagnosis not present

## 2021-05-29 DIAGNOSIS — M542 Cervicalgia: Secondary | ICD-10-CM | POA: Diagnosis not present

## 2021-05-29 DIAGNOSIS — R0683 Snoring: Secondary | ICD-10-CM

## 2021-05-29 DIAGNOSIS — R29818 Other symptoms and signs involving the nervous system: Secondary | ICD-10-CM | POA: Diagnosis not present

## 2021-05-29 DIAGNOSIS — R200 Anesthesia of skin: Secondary | ICD-10-CM | POA: Diagnosis not present

## 2021-05-29 DIAGNOSIS — G4719 Other hypersomnia: Secondary | ICD-10-CM

## 2021-05-29 DIAGNOSIS — R519 Headache, unspecified: Secondary | ICD-10-CM

## 2021-05-29 MED ORDER — TOPIRAMATE 50 MG PO TABS: 50.0000 mg | ORAL_TABLET | Freq: Two times a day (BID) | ORAL | 5 refills | Status: DC

## 2021-05-29 NOTE — Progress Notes (Signed)
[facility]  PATIENT: Kimberly Cabrera DOB: 1975-08-14  REFERRING DOCTOR OR PCP: Rochel Brome, MD SOURCE: Notes from primary care, rheumatology  _________________________________   HISTORICAL  CHIEF COMPLAINT:  Chief Complaint  Patient presents with   New Patient (Initial Visit)    RM 2. Referral to r/o MS. Unable to do MRI in hospital even w/ ativan. Claustrophobic. Reports muscle weakness, pain. Tremors mainly in hands. Sx started about 6-8 months ago.  Got bit by tick 09/2019. Went for treatment for this. Ever since, sx started after this. Has seen Rheumatology/PCP. Skin itching intermittently. Vision blurry/has not seen eye doctor in years. No correction at this time. Headaches daily, takes goody powder daily. Never been on headache preventative. Some days/unable to get up.   Other    Works from home. Very fatigued now. She is a Marine scientist.     HISTORY OF PRESENT ILLNESS:  At the pleasure of seeing patient, [name], at [facility] for neurologic consultation regarding her fatigue, headaches and other symptoms.  She is a 46 yo woman who reports multiple neurologic symptoms and severe fatigue.    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  She also had fatigue.    She did a two week course of doxycycline.   The fatigue never improved.   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    She physically feels poor.      She reports a tremor in her hands the past 6 months.   The tremor is bilateral and is worse if more fatigued and out of bed (does not note while in bed).    If she exerts herself she does worse.    She noes no association with intention. and fine motor tasks.    She also has noted numbness in her left face.   She went to the ED and had a CT.   An MRI was attempted but she had a panic attack and could not start it.    She reports itching sensation in arms  and legs.     She notes a lot of headaches.  They are left > right.   She denies nausea or vomiting.   When severe, she notes photophobia.    She takes [employer].   She tried to stop but had rebound headaches and went back to [place] powders 2-3 a day.     She repots her vision is poor but has not seen an ophthalmologist or optometrist anytime recently.     She has had trouble swallowing.   She choked on a pill and has done worse since then.   She has trouble swallowing pills.  She does better if she takes Ativan.   She is scheduled for EGD and colonoscopy in March.    She saw rheumatology and was told she had no rheumatologic issue.   She did not have any labwork.  There was a report that she has had ANA tested (negative) in the past but I was unable to find the actual results.  She notes constant pain in legs and hips.     She wakes up a couple times every night.   She snores.  Husband has not noted OSA signs   She has gained 50 pounds in last few years.    EPWORTH SLEEPINESS SCALE  On a scale of 0 -  3 what is the chance of dozing:  Sitting and Reading:   3 Watching TV:    3 Sitting inactive in a public place: 0 Passenger in car for one hour: 0 Lying down to rest in the afternoon: 3 Sitting and talking to someone: 0 Sitting quietly after lunch:  3 In a car, stopped in traffic:  0  Total (out of 24):   12/24 mild EDS.       03/21/2021:  B12 was normal.  Vit D was 13/2 (taking 50000 weekly suppl), elevated cholesterol = 273, LDL = 186.   CMP, CBC/D were ok.    She had mononucleosis as a teenager.   She has stopped smoking.  She has reduced eggs and red meat intake and will have lipids rechecked soon.       REVIEW OF SYSTEMS: Constitutional: No fevers, chills, sweats, or change in appetite Eyes: No visual changes, double vision, eye pain Ear, nose and throat: No hearing loss, ear pain, nasal congestion, sore throat Cardiovascular: No chest pain, palpitations Respiratory:   No shortness of breath at rest or with exertion.   No wheezes GastrointestinaI: No nausea, vomiting, diarrhea, abdominal pain, fecal incontinence Genitourinary:  No dysuria, urinary retention or frequency.  No nocturia. Musculoskeletal:  No neck pain, back pain Integumentary: No rash, pruritus, skin lesions Neurological: as above Psychiatric: No depression at this time.  No anxiety Endocrine: No palpitations, diaphoresis, change in appetite, change in weigh or increased thirst Hematologic/Lymphatic:  No anemia, purpura, petechiae. Allergic/Immunologic: No itchy/runny eyes, nasal congestion, recent allergic reactions, rashes  ALLERGIES: Allergies  Allergen Reactions   Cefdinir Diarrhea    HOME MEDICATIONS:  Current Outpatient Medications:    Aspirin-Acetaminophen (GOODYS BODY PAIN PO), Take by mouth in the morning and at bedtime., Disp: , Rfl:    FLUoxetine (PROZAC) 40 MG capsule, Take 50 mg by mouth daily., Disp: , Rfl:    LORazepam (ATIVAN) 0.5 MG tablet, Take by mouth., Disp: , Rfl:    Vitamin D, Ergocalciferol, (DRISDOL) 1.25 MG (50000 UNIT) CAPS capsule, Take 1 capsule (50,000 Units total) by mouth every 7 (seven) days., Disp: 5 capsule, Rfl: 2   rosuvastatin (CRESTOR) 5 MG tablet, Take 1 tablet (5 mg total) by mouth daily. (Patient not taking: Reported on 05/29/2021), Disp: 90 tablet, Rfl: 0  PAST MEDICAL HISTORY: Past Medical History:  Diagnosis Date   Anxiety    Asthma    Hyperlipidemia    Kidney stones     PAST SURGICAL HISTORY: Past Surgical History:  Procedure Laterality Date   ABDOMINAL HYSTERECTOMY     46 yo. Total.   APPENDECTOMY     ECTOPIC PREGNANCY SURGERY N/A    TONSILLECTOMY      FAMILY HISTORY: Family History  Problem Relation Age of Onset   Hyperlipidemia Mother    Diabetes Mother    Hyperlipidemia Father    Diabetes Father    Heart disease Father    Dementia Father    Melanoma Father    Melanoma Sister    Diabetes Sister    Hypertension  Maternal Grandmother    Mental illness Maternal Grandmother    Asthma Paternal Grandfather    Heart attack Paternal Grandfather    Arthritis Paternal Grandfather     SOCIAL HISTORY:  Social History   Socioeconomic History   Marital status: Married    Spouse name: Mada Lazarin   Number of children: Not on file   Years of education: Not on file   Highest education  level: Not on file  Occupational History   Occupation: Charity fundraiser    Comment: [employer] ([place]  Tobacco Use   Smoking status: Former    Packs/day: 0.50    Years: 5.00    Pack years: 2.50    Types: Cigarettes    Quit date: 05/07/2021    Years since quitting: 0.0   Smokeless tobacco: Never   Tobacco comments:    Smokes 2-3 cigarettes a day  Substance and Sexual Activity   Alcohol use: Never   Drug use: Never   Sexual activity: Not Currently    Partners: Male  Other Topics Concern   Not on file  Social History Narrative   Not on file   Social Determinants of Health   Financial Resource Strain: Not on file  Food Insecurity: Not on file  Transportation Needs: Not on file  Physical Activity: Not on file  Stress: Not on file  Social Connections: Not on file  Intimate Partner Violence: Not on file     PHYSICAL EXAM  Vitals:   05/29/21 1001  BP: 124/87  Pulse: 99  Weight: 209 lb (94.8 kg)  Height: 5\' 3"  (1.6 m)    Body mass index is 37.02 kg/m.  Vision Screening   Right eye Left eye Both eyes  Without correction 20/70 20/70 20/50   With correction         General: The patient is well-developed and well-nourished and in no acute distress  HEENT:  Head is [place]/AT.  Sclera are anicteric.  Funduscopic exam shows normal optic discs and retinal vessels.  Neck: No carotid bruits are noted.  The neck is tender over left occiput.  Cardiovascular: The heart has a regular rate and rhythm with a normal S1 and S2. There were no murmurs, gallops or rubs.    Skin: Extremities are without rash or   edema.  Musculoskeletal:  Back is nontender  Neurologic Exam  Mental status: The patient is alert and oriented x 3 at the time of the examination. The patient has apparent normal recent and remote memory, with an apparently normal attention span and concentration ability.   Speech is normal.  Cranial nerves: Extraocular movements are full. Pupils are equal, round, and reactive to light and accomodation.  Visual fields are full.  Facial symmetry is present. There is good facial sensation to soft touch bilaterally.Facial strength is normal.  Trapezius and sternocleidomastoid strength is normal. No dysarthria is noted.  The tongue is midline, and the patient has symmetric elevation of the soft palate. No obvious hearing deficits are noted.  Motor:  Muscle bulk is normal.   Tone is normal. Strength is  5 / 5 in all 4 extremities.   Sensory: Sensory testing is intact to pinprick, soft touch and vibration sensation in all 4 extremities.  Coordination: Cerebellar testing reveals good finger-nose-finger and heel-to-shin bilaterally.  Gait and station: Station is normal.   Gait is normal. Tandem gait is mildly wide.  Romberg is borderline.   Reflexes: Deep tendon reflexes are symmetric and normal, 2 in arms and 3 in legs.   Plantar responses are flexor.    DIAGNOSTIC DATA (LABS, IMAGING, TESTING) - I reviewed patient records, labs, notes, testing and imaging myself where available.  Lab Results  Component Value Date   WBC 6.2 03/21/2021   HGB 14.7 03/21/2021   HCT 44.0 03/21/2021   MCV 86 03/21/2021   PLT 276 03/21/2021      Component Value Date/Time   NA 141 03/21/2021  0946   K 4.6 03/21/2021 0946   CL 102 03/21/2021 0946   CO2 23 03/21/2021 0946   GLUCOSE 105 (H) 03/21/2021 0946   BUN 7 03/21/2021 0946   CREATININE 0.82 03/21/2021 0946   CALCIUM 9.8 03/21/2021 0946   PROT 7.1 03/21/2021 0946   ALBUMIN 4.7 03/21/2021 0946   AST 29 03/21/2021 0946   ALT 37 (H) 03/21/2021 0946    ALKPHOS 92 03/21/2021 0946   BILITOT 0.3 03/21/2021 0946   Lab Results  Component Value Date   CHOL 273 (H) 03/21/2021   HDL 35 (L) 03/21/2021   LDLCALC 186 (H) 03/21/2021   TRIG 265 (H) 03/21/2021   CHOLHDL 7.8 (H) 03/21/2021   No results found for: HGBA1C Lab Results  Component Value Date   VITAMINB12 543 03/21/2021   Lab Results  Component Value Date   TSH 2.140 03/21/2021       ASSESSMENT AND PLAN  Chronic nonintractable headache, unspecified headache type - Plan: ANA+ENA+DNA/DS+Scl 70+SjoSSA/B, Sedimentation rate, MR BRAIN W WO CONTRAST, C-reactive protein  Numbness - Plan: ANA+ENA+DNA/DS+Scl 70+SjoSSA/B, Sedimentation rate, MR BRAIN W WO CONTRAST, MR CERVICAL SPINE W WO CONTRAST  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 Hable is a 46 year old woman with fatigue, numbness, tremor and other symptoms that temporally started or worsened after a tick bite and diagnosis of [facility] spotted fever.  On exam, deep tendon reflexes were top normal in the legs and her Romberg was borderline positive and tandem gait was mildly wide.  Additionally, she had moderate tenderness over the splenius capitis muscle/occipital nerve on the left.  The exam was otherwise normal.  She is concerned about the possibility of MS.  Her symptoms then time course would not be typical for that diagnosis.    Due to the physical exam findings we will check an MRI of the brain and cervical spine to make sure that there is not demyelination or an intrinsic or extrinsic myelopathy.  I will also check some lab work. 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  I did a left splenius capitis injection with 3 cc Marcaine containing 80 mg Depo-Medrol using sterile  technique.  She tolerated the procedure well and pain was better afterwards. Topiramate 50 mg nightly for headaches.  If headaches are better, hopefully she will be able to reduce the Goody powders that likely are causing superimposed rebound headaches Etiology of her fatigue is unclear.  She does snore and have excessive daytime sleepiness.  We will check a home sleep study to determine if she has OSA and treat with CPAP if present. Return in 3 months or sooner if there are new or worsening neurologic symptoms.  Thank you for asking me to see Ms. Driscoll.  Please let me know if I can be of further assistance with her or other patients in the future.   Wanette Robison A. Felecia Shelling, MD, [facility] [id number], [id number] AM Certified in Neurology, Clinical Neurophysiology, Sleep Medicine and Neuroimaging  [facility] [address], [place], [place] [address] [phone]

## 2021-05-31 LAB — ANA+ENA+DNA/DS+SCL 70+SJOSSA/B
ANA Titer 1: NEGATIVE
ENA RNP Ab: 0.2 AI (ref 0.0–0.9)
ENA SM Ab Ser-aCnc: <0.2 AI (ref 0.0–0.9)
ENA SSA (RO) Ab: <0.2 AI (ref 0.0–0.9)
ENA SSB (LA) Ab: <0.2 AI (ref 0.0–0.9)
Scleroderma (Scl-70) (ENA) Antibody, IgG: <0.2 AI (ref 0.0–0.9)
dsDNA Ab: <1 IU/mL (ref 0–9)

## 2021-05-31 LAB — C-REACTIVE PROTEIN: CRP: 3 mg/L (ref 0–10)

## 2021-05-31 LAB — SEDIMENTATION RATE: Sed Rate: 39 mm/hr — ABNORMAL HIGH (ref 0–32)

## 2021-06-06 ENCOUNTER — Telehealth: Payer: Self-pay | Admitting: Neurology

## 2021-06-06 NOTE — Telephone Encounter (Signed)
Drucie Opitz: [id number] & [phone] (exp. 06/06/21 to 12/03/21)  [facility] sent to [facility] to be scheduled sedated.

## 2021-06-06 NOTE — Telephone Encounter (Signed)
it has been approved by Monia Pouch but [place] reach's out to the pt to schedule they will not release the authorization number until that is done

## 2021-06-08 ENCOUNTER — Encounter: Payer: Self-pay | Admitting: Neurology

## 2021-06-11 NOTE — Telephone Encounter (Signed)
Patient sedated MRI's are scheduled at [facility] for 07/17/21. I put H&P form in Dr. Felecia Shelling box to be filled out.

## 2021-06-12 ENCOUNTER — Other Ambulatory Visit: Payer: Self-pay | Admitting: Neurology

## 2021-06-12 MED ORDER — IMIPRAMINE HCL 25 MG PO TABS: 25.0000 mg | ORAL_TABLET | Freq: Every day | ORAL | 5 refills | Status: DC

## 2021-06-20 ENCOUNTER — Telehealth: Payer: Self-pay | Admitting: Neurology

## 2021-06-20 ENCOUNTER — Encounter: Payer: Self-pay | Admitting: Neurology

## 2021-06-20 ENCOUNTER — Ambulatory Visit (INDEPENDENT_AMBULATORY_CARE_PROVIDER_SITE_OTHER): Payer: No Typology Code available for payment source | Admitting: Neurology

## 2021-06-20 VITALS — BP 122/86 | HR 74 | Ht 63.0 in | Wt 215.5 lb

## 2021-06-20 DIAGNOSIS — E782 Mixed hyperlipidemia: Secondary | ICD-10-CM | POA: Diagnosis not present

## 2021-06-20 DIAGNOSIS — G8929 Other chronic pain: Secondary | ICD-10-CM

## 2021-06-20 DIAGNOSIS — M2559 Pain in other specified joint: Secondary | ICD-10-CM

## 2021-06-20 DIAGNOSIS — M542 Cervicalgia: Secondary | ICD-10-CM | POA: Insufficient documentation

## 2021-06-20 DIAGNOSIS — R519 Headache, unspecified: Secondary | ICD-10-CM

## 2021-06-20 DIAGNOSIS — R5383 Other fatigue: Secondary | ICD-10-CM

## 2021-06-20 NOTE — Telephone Encounter (Signed)
Pt states she is having weird headaches.  Pt states on the left side of head there is numbness and a tingly feeling.  Pt's neck is stiff and she feels nauseated.  Please call on house phone

## 2021-06-20 NOTE — Progress Notes (Signed)
[facility]  PATIENT: Kimberly Cabrera DOB: [birth year]  REFERRING DOCTOR OR PCP: Rochel Brome, MD SOURCE: Notes from primary care, rheumatology  _________________________________   HISTORICAL  CHIEF COMPLAINT:  Chief Complaint  Patient presents with   Follow-up    Rm 1, alone. Here for repeat trigger point injections. Pt reports being nauseated and seeing flashes in her L eye. Reports dizzines as well.     HISTORY OF PRESENT ILLNESS:  At the pleasure of seeing patient, Kimberly Cabrera, at [facility] for neurologic consultation regarding her fatigue, headaches and other symptoms.  Update 06/20/2021: She is having more headache and also has nausea and dizziness.   She continues to experience left facial numbness and word finding issues.   I reviewed the CT scan of the head that she had in 2022 when she presented to the emergency room.  It was normal.  She had been unable to obtain an MRI due to a panic attack.  She is scheduled to have conscious sedation for an MRI of the brain and cervical spine at the hospital.   She continues to experience headache that is in the back of the head.  She takes Goody powders bid most days due to the headache.   The trigger point we did at the initial visit helped a lot for several days but the pain gradually worsened.    Due to h/o Kidney stones, we did not start the topiramate.   She has picked up the imipramine but has not yet started.      She continues to experience a mild tremor.  This is bilateral and not associated with intention.  She notes some difficulty with swallowing.  Since the tick bite she also has had constant pain in her legs and hips.  She saw rheumatology but no rheumatologic issue was discovered.  Labs showed mildly elevated ESR but the CRP was normal.  ANA was normal.  History of neurologic symptoms: She has had neurologic symptoms and severe fatigue since a tick bite 09/2019.  She had a diagnosis  of RMSF based on bloodwork and pain in back of head where she had the bite.   She did a two week course of doxycycline.   The fatigue never improved.   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    She physically feels poor.      She wakes up a couple times every night.   She snores.  Husband has not noted OSA signs   She has gained 50 pounds in last few years.  She has mild excessive daytime sleepiness.  She has not yet done the home sleep study.  EPWORTH SLEEPINESS SCALE  On a scale of 0 - 3 what is the chance of dozing:  Sitting and Reading:   3 Watching TV:    3 Sitting inactive in a public place: 0 Passenger in car for one hour: 0 Lying down to rest in the afternoon: 3 Sitting and talking to someone: 0 Sitting quietly after lunch:  3 In a car, stopped in traffic:  0  Total (out of 24):   12/24 mild EDS.      Data: 03/21/2021:  B12 was normal.  Vit D was 13/2 (taking 50000 weekly suppl), elevated cholesterol = 273, LDL = 186.   CMP, CBC/D were ok.    05/29/2021: ESR was mildly elevated (39), CRP, ANA were normal.  01/11/2021: CT scan was normal.  Vascular risk factors:   She  stopped smoking (she was 1ppd x 15).  She has hyperlipidemia and has changed her diet and started Crestor.  She does not have hypertension or diabetes.   REVIEW OF SYSTEMS: Constitutional: No fevers, chills, sweats, or change in appetite Eyes: No visual changes, double vision, eye pain Ear, nose and throat: No hearing loss, ear pain, nasal congestion, sore throat Cardiovascular: No chest pain, palpitations Respiratory:  No shortness of breath at rest or with exertion.   No wheezes GastrointestinaI: No nausea, vomiting, diarrhea, abdominal pain, fecal incontinence Genitourinary:  No dysuria, urinary retention or frequency.  No nocturia. Musculoskeletal:  No neck pain, back pain Integumentary: No rash, pruritus, skin lesions Neurological: as above Psychiatric: No depression at this time.  No  anxiety Endocrine: No palpitations, diaphoresis, change in appetite, change in weigh or increased thirst Hematologic/Lymphatic:  No anemia, purpura, petechiae. Allergic/Immunologic: No itchy/runny eyes, nasal congestion, recent allergic reactions, rashes  ALLERGIES: Allergies  Allergen Reactions   Cefdinir Diarrhea    HOME MEDICATIONS:  Current Outpatient Medications:    Aspirin-Acetaminophen (GOODYS BODY PAIN PO), Take by mouth in the morning and at bedtime., Disp: , Rfl:    FLUoxetine (PROZAC) 40 MG capsule, Take 50 mg by mouth daily., Disp: , Rfl:    LORazepam (ATIVAN) 0.5 MG tablet, Take by mouth., Disp: , Rfl:    Vitamin D, Ergocalciferol, (DRISDOL) 1.25 MG (50000 UNIT) CAPS capsule, Take 1 capsule (50,000 Units total) by mouth every 7 (seven) days., Disp: 5 capsule, Rfl: 2   imipramine (TOFRANIL) 25 MG tablet, Take 1 tablet (25 mg total) by mouth at bedtime. (Patient not taking: Reported on 06/20/2021), Disp: 30 tablet, Rfl: 5   rosuvastatin (CRESTOR) 5 MG tablet, Take 1 tablet (5 mg total) by mouth daily. (Patient not taking: Reported on 06/20/2021), Disp: 90 tablet, Rfl: 0  PAST MEDICAL HISTORY: Past Medical History:  Diagnosis Date   Anxiety    Asthma    Hyperlipidemia    Kidney stones     PAST SURGICAL HISTORY: Past Surgical History:  Procedure Laterality Date   ABDOMINAL HYSTERECTOMY     46 yo. Total.   APPENDECTOMY     ECTOPIC PREGNANCY SURGERY N/A    TONSILLECTOMY      FAMILY HISTORY: Family History  Problem Relation Age of Onset   Hyperlipidemia Mother    Diabetes Mother    Hyperlipidemia Father    Diabetes Father    Heart disease Father    Dementia Father    Melanoma Father    Melanoma Sister    Diabetes Sister    Hypertension Maternal Grandmother    Mental illness Maternal Grandmother    Asthma Paternal Grandfather    Heart attack Paternal Grandfather    Arthritis Paternal Grandfather     SOCIAL HISTORY:  Social History   Socioeconomic  History   Marital status: Married    Spouse name: Luceil Herrin   Number of children: Not on file   Years of education: Not on file   Highest education level: Not on file  Occupational History   Occupation: Therapist, sports    Comment: [employer] ([place])  Tobacco Use   Smoking status: Former    Packs/day: 0.50    Years: 5.00    Pack years: 2.50    Types: Cigarettes    Quit date: 05/07/2021    Years since quitting: 0.1   Smokeless tobacco: Never   Tobacco comments:    Smokes 2-3 cigarettes a day  Substance and Sexual Activity  Alcohol use: Never   Drug use: Never   Sexual activity: Not Currently    Partners: Male  Other Topics Concern   Not on file  Social History Narrative   Not on file   Social Determinants of Health   Financial Resource Strain: Not on file  Food Insecurity: Not on file  Transportation Needs: Not on file  Physical Activity: Not on file  Stress: Not on file  Social Connections: Not on file  Intimate Partner Violence: Not on file     PHYSICAL EXAM  Vitals:   06/20/21 1617  BP: 122/86  Pulse: 74  SpO2: 97%  Weight: 215 lb 8 oz (97.8 kg)  Height: '5\' 3"'  (1.6 m)    Body mass index is 38.17 kg/m.  No results found.     General: The patient is well-developed and well-nourished and in no acute distress  HEENT:  Head is Weld/AT.  Sclera are anicteric.  Funduscopic exam shows normal optic discs and retinal vessels.  Neck: No carotid bruits are noted.  The neck is tender over left occiput.  Cardiovascular: The heart has a regular rate and rhythm with a normal S1 and S2. There were no murmurs, gallops or rubs.    Skin: Extremities are without rash or  edema.  Musculoskeletal:  Back is nontender  Neurologic Exam  Mental status: The patient is alert and oriented x 3 at the time of the examination. The patient has apparent normal recent and remote memory, with an apparently normal attention span and concentration ability.   Speech is normal.  Cranial  nerves: Extraocular movements are full. Pupils are equal, round, and reactive to light and accomodation.  Visual fields are full.  Facial symmetry is present. There is good facial sensation to soft touch bilaterally.Facial strength is normal.  Trapezius and sternocleidomastoid strength is normal. No dysarthria is noted.  The tongue is midline, and the patient has symmetric elevation of the soft palate. No obvious hearing deficits are noted.  Motor:  Muscle bulk is normal.   Tone is normal. Strength is  5 / 5 in all 4 extremities.   Sensory: Sensory testing is intact to pinprick, soft touch and vibration sensation in all 4 extremities.  Coordination: Cerebellar testing reveals good finger-nose-finger and heel-to-shin bilaterally.  Gait and station: Station is normal.   Gait is normal. Tandem gait is mildly wide.  Romberg is borderline.   Reflexes: Deep tendon reflexes are symmetric and normal, 2 in arms and 3 in legs.   Plantar responses are flexor.    DIAGNOSTIC DATA (LABS, IMAGING, TESTING) - I reviewed patient records, labs, notes, testing and imaging myself where available.  Lab Results  Component Value Date   WBC 6.2 03/21/2021   HGB 14.7 03/21/2021   HCT 44.0 03/21/2021   MCV 86 03/21/2021   PLT 276 03/21/2021      Component Value Date/Time   NA 141 03/21/2021 0946   K 4.6 03/21/2021 0946   CL 102 03/21/2021 0946   CO2 23 03/21/2021 0946   GLUCOSE 105 (H) 03/21/2021 0946   BUN 7 03/21/2021 0946   CREATININE 0.82 03/21/2021 0946   CALCIUM 9.8 03/21/2021 0946   PROT 7.1 03/21/2021 0946   ALBUMIN 4.7 03/21/2021 0946   AST 29 03/21/2021 0946   ALT 37 (H) 03/21/2021 0946   ALKPHOS 92 03/21/2021 0946   BILITOT 0.3 03/21/2021 0946   Lab Results  Component Value Date   CHOL 273 (H) 03/21/2021   HDL 35 (  L) 03/21/2021   LDLCALC 186 (H) 03/21/2021   TRIG 265 (H) 03/21/2021   CHOLHDL 7.8 (H) 03/21/2021   No results found for: HGBA1C Lab Results  Component Value Date    FFKVQOHC09 794 03/21/2021   Lab Results  Component Value Date   TSH 2.140 03/21/2021       ASSESSMENT AND PLAN  Chronic nonintractable headache, unspecified headache type  Neck pain  Pain in other joint  Mixed hyperlipidemia  Other fatigue    Bilateral splenius capitis and C2-C3 paraspinal muscle trigger point injection with 5 cc Marcaine containing 80 mg Depo-Medrol using sterile technique.  She tolerated the procedure well and pain was better afterwards. Start imipramine 25 mg nightly and increase if tolerated well.  If headaches are better, hopefully she will be able to reduce the Goody powders that likely are causing superimposed rebound headaches We will check a home sleep study to determine if she has OSA and treat with CPAP if present. She is scheduled to have an MRI of the brain and cervical spine at the hospital with conscious sedation.  She has been unable to tolerate an MRI previously.  We will let her know the results of the MRI after we review it.   Return in 4 months or sooner if there are new or worsening neurologic symptoms.   Maiko Salais A. Felecia Shelling, MD, [facility] 9/97/1820, 9:90 PM Certified in Neurology, Clinical Neurophysiology, Sleep Medicine and Neuroimaging  [facility] [address], [place], [place] [address] [phone]

## 2021-06-20 NOTE — Telephone Encounter (Signed)
Called pt back. Having numb/ting on left side of head and moving over to right side. This is constant. Sx started around 06/09/21. She is dizzy, nauseated (usually not nauseated) now. This is new for her. Has MRI's scheduled for 07/17/21. She denies any pain with headache. She works from home and returning to work tomorrow for the next few days. Picked up imipramine 25mg  this past Monday 06/18/21 (called in by MD 06/02/21) but has not started this d/t not feeling right. Advised MD will most likely want her to start taking this first to see if this will help sx.  She asked about coming in for appt today/get repeat trigger point inj. Advised there are currently no openings right now. Will see what MD suggests and will call her back.

## 2021-06-20 NOTE — Telephone Encounter (Signed)
Called pt back. She agreed to appt today at 430p. She will check in at 4pm. Scheduled appt.

## 2021-07-06 ENCOUNTER — Ambulatory Visit: Payer: Managed Care, Other (non HMO) | Admitting: Family Medicine

## 2021-07-11 ENCOUNTER — Ambulatory Visit (INDEPENDENT_AMBULATORY_CARE_PROVIDER_SITE_OTHER): Payer: No Typology Code available for payment source | Admitting: Neurology

## 2021-07-11 DIAGNOSIS — G4733 Obstructive sleep apnea (adult) (pediatric): Secondary | ICD-10-CM

## 2021-07-11 DIAGNOSIS — G4719 Other hypersomnia: Secondary | ICD-10-CM

## 2021-07-11 DIAGNOSIS — R0683 Snoring: Secondary | ICD-10-CM

## 2021-07-12 ENCOUNTER — Encounter (HOSPITAL_COMMUNITY): Payer: Self-pay | Admitting: *Deleted

## 2021-07-12 NOTE — Progress Notes (Signed)
DUE TO COVID-19 ONLY ONE VISITOR IS ALLOWED TO COME WITH YOU AND STAY IN THE WAITING ROOM ONLY DURING PRE OP AND PROCEDURE DAY OF SURGERY.   PCP - Dr Rochel Brome Cardiologist - n/a  CT Chest x-ray - 02/22/21 EKG - 01/12/21 Stress Test - n/a ECHO - n/a Cardiac Cath - n/a  ICD Pacemaker/Loop - n/a  Sleep Study -  n/a CPAP - none  STOP now taking any Aspirin (unless otherwise instructed by your surgeon), Aleve, Naproxen, Ibuprofen, Motrin, Advil, Goody's, BC's, all herbal medications, fish oil, and all vitamins.   Coronavirus Screening Covid test n/a Ambulatory Surgery Do you have any of the following symptoms:  Cough yes/no: No Fever (>100.16F)  yes/no: No Runny nose yes/no: No Sore throat yes/no: No Difficulty breathing/shortness of breath  yes/no: No  Have you traveled in the last 14 days and where? yes/no: No  Patient verbalized understanding of instructions that were given via phone.

## 2021-07-16 ENCOUNTER — Encounter: Payer: Self-pay | Admitting: *Deleted

## 2021-07-16 ENCOUNTER — Encounter: Payer: Self-pay | Admitting: Family Medicine

## 2021-07-16 ENCOUNTER — Other Ambulatory Visit: Payer: Self-pay

## 2021-07-16 ENCOUNTER — Encounter (HOSPITAL_COMMUNITY): Payer: Self-pay | Admitting: *Deleted

## 2021-07-16 ENCOUNTER — Ambulatory Visit (INDEPENDENT_AMBULATORY_CARE_PROVIDER_SITE_OTHER): Payer: No Typology Code available for payment source | Admitting: Family Medicine

## 2021-07-16 VITALS — BP 106/60 | HR 108 | Temp 97.0°F | Resp 16 | Ht 63.0 in | Wt 219.0 lb

## 2021-07-16 DIAGNOSIS — R220 Localized swelling, mass and lump, head: Secondary | ICD-10-CM

## 2021-07-16 DIAGNOSIS — G8929 Other chronic pain: Secondary | ICD-10-CM

## 2021-07-16 DIAGNOSIS — J3489 Other specified disorders of nose and nasal sinuses: Secondary | ICD-10-CM

## 2021-07-16 DIAGNOSIS — F411 Generalized anxiety disorder: Secondary | ICD-10-CM

## 2021-07-16 DIAGNOSIS — R739 Hyperglycemia, unspecified: Secondary | ICD-10-CM

## 2021-07-16 DIAGNOSIS — R519 Headache, unspecified: Secondary | ICD-10-CM

## 2021-07-16 DIAGNOSIS — R700 Elevated erythrocyte sedimentation rate: Secondary | ICD-10-CM

## 2021-07-16 DIAGNOSIS — E782 Mixed hyperlipidemia: Secondary | ICD-10-CM | POA: Diagnosis not present

## 2021-07-16 MED ORDER — SULFAMETHOXAZOLE-TRIMETHOPRIM 400-80 MG PO TABS: 2.0000 | ORAL_TABLET | Freq: Two times a day (BID) | ORAL | 0 refills | Status: DC

## 2021-07-16 MED ORDER — SULFAMETHOXAZOLE-TRIMETHOPRIM 200-40 MG/5ML PO SUSP: 20.0000 mL | Freq: Two times a day (BID) | ORAL | 0 refills | Status: AC

## 2021-07-16 NOTE — Progress Notes (Signed)
Subjective:  Patient ID: Kimberly Cabrera, female    DOB: 28-Jan-1976  Age: 46 y.o. MRN: [id number]  Chief Complaint  Patient presents with   Hyperlipidemia    HPI Hyperlipidemia:  low fat, low cholesterol diet.  She did not begin statin as prescribed.  She has made changes to her diet. She quit smoking mid December.   Patient has see Dr Felecia Shelling for possible MS. He does not feel she has MS but he is repeating an MRI tomorrow. Increased anxiety related to MRI.  GAD: on prozac 50 mg daily and lorazepam 0.5 mg three times a day as needed.  Current Outpatient Medications on File Prior to Visit  Medication Sig Dispense Refill   albuterol (VENTOLIN HFA) 108 (90 Base) MCG/ACT inhaler Inhale 2 puffs into the lungs every 6 (six) hours as needed for wheezing.     Aspirin-Acetaminophen (GOODYS BODY PAIN PO) Take 1 Package by mouth in the morning and at bedtime.     FLUoxetine (PROZAC) 10 MG capsule Take 10 mg by mouth See admin instructions. Take with 40 ng cap for a total of 50 mg daily     FLUoxetine (PROZAC) 40 MG capsule Take 40 mg by mouth See admin instructions. Take with 10 mg for a total of 50 mg daily     imipramine (TOFRANIL) 25 MG tablet Take 1 tablet (25 mg total) by mouth at bedtime. 30 tablet 5   LORazepam (ATIVAN) 0.5 MG tablet Take 0.5 mg by mouth 3 (three) times daily as needed for anxiety.     Vitamin D, Ergocalciferol, (DRISDOL) 1.25 MG (50000 UNIT) CAPS capsule Take 1 capsule (50,000 Units total) by mouth every 7 (seven) days. 5 capsule 2   rosuvastatin (CRESTOR) 5 MG tablet Take 1 tablet (5 mg total) by mouth daily. (Patient not taking: Reported on 06/20/2021) 90 tablet 0   No current facility-administered medications on file prior to visit.   Past Medical History:  Diagnosis Date   Anxiety    Asthma    seasonal asthma   Depression    no current problem   GERD (gastroesophageal reflux disease)    occasional - otc med prn   Headache    History of kidney stones    surgery to  remove   Hyperlipidemia    no meds, diet controlled   Past Surgical History:  Procedure Laterality Date   ABDOMINAL HYSTERECTOMY     46 yo. Total.   APPENDECTOMY     ECTOPIC PREGNANCY SURGERY N/A    laparoscopic   RADIOLOGY WITH ANESTHESIA N/A 07/17/2021   Procedure: MRI WITH ANESTHESIA BRAIN WITH AND WITHOUT CONTRAST, MRI CERVICAL SPINE WITH AND WITHOUT CONTRAST;  Surgeon: Radiologist, Medication, MD;  Location: [facility];  Service: Radiology;  Laterality: N/A;   removal kidney stone     TONSILLECTOMY     WISDOM TOOTH EXTRACTION      Family History  Problem Relation Age of Onset   Hyperlipidemia Mother    Diabetes Mother    Hyperlipidemia Father    Diabetes Father    Heart disease Father    Dementia Father    Melanoma Father    Melanoma Sister    Diabetes Sister    Hypertension Maternal Grandmother    Mental illness Maternal Grandmother    Asthma Paternal Grandfather    Heart attack Paternal Grandfather    Arthritis Paternal Grandfather    Social History   Socioeconomic History   Marital status: Married    Spouse  name: Tiny Chaudhary   Number of children: Not on file   Years of education: Not on file   Highest education level: Not on file  Occupational History   Occupation: Therapist, sports    Comment: [employer] ([place])  Tobacco Use   Smoking status: Former    Packs/day: 0.50    Years: 5.00    Pack years: 2.50    Types: Cigarettes    Quit date: 05/07/2021    Years since quitting: 0.1   Smokeless tobacco: Never   Tobacco comments:    Smokes 2-3 cigarettes a day  Vaping Use   Vaping Use: Never used  Substance and Sexual Activity   Alcohol use: Never   Drug use: Never   Sexual activity: Not Currently    Partners: Male    Birth control/protection: Surgical    Comment: Hysterectomy  Other Topics Concern   Not on file  Social History Narrative   Not on file   Social Determinants of Health   Financial Resource Strain: Not on file  Food Insecurity: Not on file   Transportation Needs: Not on file  Physical Activity: Not on file  Stress: Not on file  Social Connections: Not on file    Review of Systems  Constitutional:  Positive for fatigue. Negative for chills and fever.  HENT:  Negative for congestion, rhinorrhea and sore throat.        Sore in left nares.  Respiratory:  Negative for cough and shortness of breath.   Cardiovascular:  Negative for chest pain.  Gastrointestinal:  Negative for abdominal pain, constipation, diarrhea, nausea and vomiting.  Genitourinary:  Negative for dysuria and urgency.  Musculoskeletal:  Positive for back pain and myalgias.  Neurological:  Positive for headaches (Dr. Felecia Shelling thinks she has Occipital neuralgia. He is giving her occipital injections. No stroke.). Negative for dizziness, weakness and light-headedness.       Facial numbness and tingling (left sided).   Psychiatric/Behavioral:  Negative for dysphoric mood. The patient is nervous/anxious.     Objective:  BP 106/60    Pulse (!) 108    Temp (!) 97 F (36.1 C)    Resp 16    Ht 5' 3" (1.6 m)    Wt 219 lb (99.3 kg)    BMI 38.79 kg/m   BP/Weight 07/17/2021 07/16/2021 2/83/1517  Systolic BP 616 073 710  Diastolic BP 87 60 86  Wt. (Lbs) 219 219 215.5  BMI 38.79 38.79 38.17    Physical Exam Vitals reviewed.  Constitutional:      Appearance: Normal appearance. She is obese.  HENT:     Mouth/Throat:     Comments: Sore in left side of nose.  Neck:     Vascular: No carotid bruit.  Cardiovascular:     Rate and Rhythm: Normal rate and regular rhythm.     Heart sounds: Normal heart sounds.  Pulmonary:     Effort: Pulmonary effort is normal. No respiratory distress.     Breath sounds: Normal breath sounds.  Abdominal:     General: Abdomen is flat. Bowel sounds are normal.     Palpations: Abdomen is soft.     Tenderness: There is no abdominal tenderness.  Skin:    Findings: Lesion (7 mm nodule posterior scalp. Mildly tender. no erythema. two pimples  one in each eye brow. tender over left side of nose.) present.  Neurological:     Mental Status: She is alert and oriented to person, place, and time.  Psychiatric:  Mood and Affect: Mood normal.        Behavior: Behavior normal.   Diabetic Foot Exam - Simple   No data filed      Lab Results  Component Value Date   WBC 6.2 03/21/2021   HGB 14.7 03/21/2021   HCT 44.0 03/21/2021   PLT 276 03/21/2021   GLUCOSE 101 (H) 07/16/2021   CHOL 274 (H) 07/16/2021   TRIG 237 (H) 07/16/2021   HDL 39 (L) 07/16/2021   LDLCALC 189 (H) 07/16/2021   ALT 30 07/16/2021   AST 23 07/16/2021   NA 144 07/16/2021   K 4.7 07/16/2021   CL 104 07/16/2021   CREATININE 0.81 07/16/2021   BUN 6 07/16/2021   CO2 24 07/16/2021   TSH 2.140 03/21/2021   HGBA1C 6.0 (H) 07/16/2021    Assessment & Plan:   Problem List Items Addressed This Visit       Other   Chronic nonintractable headache    Management per specialist.        Mixed hyperlipidemia    LDL and trigs high. Patient agreed to take crestor 5 mg before bed. I anticipate need for more.  Continue to work on eating a healthy diet and exercise.  Labs drawn today.        Relevant Orders   Comprehensive metabolic panel (Completed)   Lipid panel (Completed)   GAD (generalized anxiety disorder)    The current medical regimen is effective;  continue present plan and medications. Sees psychiatry.       Sore in nose - Primary    Bactrim ds rx       Subcutaneous nodule of head    Reassurance given.       Elevated blood sugar    Check a1c.  Recommend continue to work on eating healthy diet and exercise.       Relevant Orders   Hemoglobin A1c (Completed)   Elevated sed rate    Repeat esr.       Relevant Orders   Sedimentation rate (Completed)  .  Meds ordered this encounter  Medications   sulfamethoxazole-trimethoprim (BACTRIM) 400-80 MG tablet    Sig: Take 2 tablets by mouth 2 (two) times daily.    Dispense:  28  tablet    Refill:  0   sulfamethoxazole-trimethoprim (BACTRIM) 200-40 MG/5ML suspension    Sig: Take 20 mLs by mouth 2 (two) times daily for 7 days.    Dispense:  200 mL    Refill:  0    Orders Placed This Encounter  Procedures   Comprehensive metabolic panel   Hemoglobin A1c   Lipid panel   Sedimentation rate     Follow-up: Return in about 3 months (around 10/13/2021) for chronic fasting.  An After Visit Summary was printed and given to the patient.  Rochel Brome, MD [facility] [phone]

## 2021-07-16 NOTE — Progress Notes (Signed)
° °  [facility]  HOME SLEEP STUDY  STUDY DATE: 07/11/2021 PATIENT NAME: Kimberly Cabrera DOB: 1975/08/10 MRN: [id number]  ORDERING CLINICIAN: Richard A. Epimenio Foot, MD, PhD REFERRING CLINICIAN: Richard A. Sater, MD. PhD   CLINICAL INFORMATION: 46 year old woman with snoring and excessive daytime sleepiness (Epworth score 12)  FINDINGS:  Total Record Time: 7 hours 18 minutes Total Sleep Time:  6 hours 20 minutes  Percent REM:   21.8%   Calculated pAHI 3%:  8.4/h        REM pAHI:    10.9/h       NREM pAHI: 7.7/h  Pulse Mean:    84 bpm pulse Range (69 to 115 bpm)    Oxygen Sat% Mean: 95%  O2Sat Range (89-99%) O2Sat <88%: 0 minutes    IMPRESSION:  This home sleep study shows mild OSA (pAHI= 8.4 per hour)   RECOMMENDATION: As the patient also has excessive daytime sleepiness, CPAP could be considered.  However, because the OSA is mild, an oral appliance and/or weight loss might also offer benefit Follow-up with Dr. Epimenio Foot   INTERPRETING PHYSICIAN:   Pearletha Furl. Epimenio Foot, MD, PhD, [facility] Certified in Neurology, Clinical Neurophysiology, Sleep Medicine, and Neuroimaging  [facility] [place] [facility] [address], [address] [place], [place] [address] [phone]

## 2021-07-16 NOTE — Anesthesia Preprocedure Evaluation (Addendum)
Anesthesia Evaluation  Patient identified by MRN, date of birth, ID band Patient awake    Reviewed: Allergy & Precautions, NPO status , Patient's Chart, lab work & pertinent test results  Airway Mallampati: III  TM Distance: >3 FB Neck ROM: Full    Dental  (+) Teeth Intact, Dental Advisory Given   Pulmonary asthma , former smoker,    Pulmonary exam normal breath sounds clear to auscultation       Cardiovascular negative cardio ROS Normal cardiovascular exam Rhythm:Regular Rate:Normal     Neuro/Psych  Headaches, PSYCHIATRIC DISORDERS Anxiety Depression Neck pain, chronic HA    GI/Hepatic Neg liver ROS, GERD  Medicated,  Endo/Other  Obesity   Renal/GU negative Renal ROS     Musculoskeletal negative musculoskeletal ROS (+)   Abdominal   Peds  Hematology negative hematology ROS (+)   Anesthesia Other Findings   Reproductive/Obstetrics                            Anesthesia Physical Anesthesia Plan  ASA: 2  Anesthesia Plan: General   Post-op Pain Management:    Induction: Intravenous  PONV Risk Score and Plan: 3 and Midazolam, Ondansetron and Dexamethasone  Airway Management Planned: LMA  Additional Equipment:   Intra-op Plan:   Post-operative Plan: Extubation in OR  Informed Consent: I have reviewed the patients History and Physical, chart, labs and discussed the procedure including the risks, benefits and alternatives for the proposed anesthesia with the patient or authorized representative who has indicated his/her understanding and acceptance.     Dental advisory given  Plan Discussed with: CRNA  Anesthesia Plan Comments:        Anesthesia Quick Evaluation

## 2021-07-17 ENCOUNTER — Ambulatory Visit (HOSPITAL_COMMUNITY)
Admission: RE | Admit: 2021-07-17 | Discharge: 2021-07-17 | Disposition: A | Discharge status: Home / Self Care | Payer: No Typology Code available for payment source | Source: Ambulatory Visit | Attending: Neurology | Admitting: Neurology

## 2021-07-17 ENCOUNTER — Encounter: Payer: Self-pay | Admitting: Neurology

## 2021-07-17 ENCOUNTER — Encounter (HOSPITAL_COMMUNITY): Payer: Self-pay

## 2021-07-17 ENCOUNTER — Ambulatory Visit (HOSPITAL_COMMUNITY): Payer: No Typology Code available for payment source | Admitting: Anesthesiology

## 2021-07-17 ENCOUNTER — Encounter (HOSPITAL_COMMUNITY): Admission: RE | Disposition: A | Discharge status: Home / Self Care | Payer: Self-pay | Source: Home / Self Care

## 2021-07-17 ENCOUNTER — Other Ambulatory Visit: Payer: Self-pay

## 2021-07-17 ENCOUNTER — Other Ambulatory Visit: Payer: Self-pay | Admitting: Family Medicine

## 2021-07-17 ENCOUNTER — Ambulatory Visit (HOSPITAL_COMMUNITY)
Admission: RE | Admit: 2021-07-17 | Discharge: 2021-07-17 | Disposition: A | Discharge status: Home / Self Care | Payer: No Typology Code available for payment source | Attending: Neurology | Admitting: Neurology

## 2021-07-17 ENCOUNTER — Encounter: Payer: Self-pay | Admitting: Family Medicine

## 2021-07-17 ENCOUNTER — Ambulatory Visit (HOSPITAL_BASED_OUTPATIENT_CLINIC_OR_DEPARTMENT_OTHER): Payer: No Typology Code available for payment source | Admitting: Anesthesiology

## 2021-07-17 DIAGNOSIS — M542 Cervicalgia: Secondary | ICD-10-CM

## 2021-07-17 DIAGNOSIS — Z6838 Body mass index (BMI) 38.0-38.9, adult: Secondary | ICD-10-CM | POA: Insufficient documentation

## 2021-07-17 DIAGNOSIS — F419 Anxiety disorder, unspecified: Secondary | ICD-10-CM

## 2021-07-17 DIAGNOSIS — R29818 Other symptoms and signs involving the nervous system: Secondary | ICD-10-CM

## 2021-07-17 DIAGNOSIS — J45909 Unspecified asthma, uncomplicated: Secondary | ICD-10-CM | POA: Diagnosis not present

## 2021-07-17 DIAGNOSIS — R200 Anesthesia of skin: Secondary | ICD-10-CM

## 2021-07-17 DIAGNOSIS — R519 Headache, unspecified: Secondary | ICD-10-CM

## 2021-07-17 DIAGNOSIS — E669 Obesity, unspecified: Secondary | ICD-10-CM | POA: Diagnosis not present

## 2021-07-17 DIAGNOSIS — F32A Depression, unspecified: Secondary | ICD-10-CM | POA: Diagnosis not present

## 2021-07-17 DIAGNOSIS — Z87891 Personal history of nicotine dependence: Secondary | ICD-10-CM | POA: Diagnosis not present

## 2021-07-17 DIAGNOSIS — K219 Gastro-esophageal reflux disease without esophagitis: Secondary | ICD-10-CM | POA: Diagnosis not present

## 2021-07-17 HISTORY — DX: Personal history of urinary calculi: Z87.442

## 2021-07-17 HISTORY — DX: Depression, unspecified: F32.A

## 2021-07-17 HISTORY — PX: RADIOLOGY WITH ANESTHESIA: SHX6223

## 2021-07-17 HISTORY — DX: Gastro-esophageal reflux disease without esophagitis: K21.9

## 2021-07-17 HISTORY — DX: Headache, unspecified: R51.9

## 2021-07-17 LAB — COMPREHENSIVE METABOLIC PANEL
ALT: 30 IU/L (ref 0–32)
AST: 23 IU/L (ref 0–40)
Albumin/Globulin Ratio: 1.9 (ref 1.2–2.2)
Albumin: 4.6 g/dL (ref 3.8–4.8)
Alkaline Phosphatase: 94 IU/L (ref 44–121)
BUN/Creatinine Ratio: 7 — ABNORMAL LOW (ref 9–23)
BUN: 6 mg/dL (ref 6–24)
Bilirubin Total: 0.4 mg/dL (ref 0.0–1.2)
CO2: 24 mmol/L (ref 20–29)
Calcium: 9.8 mg/dL (ref 8.7–10.2)
Chloride: 104 mmol/L (ref 96–106)
Creatinine, Ser: 0.81 mg/dL (ref 0.57–1.00)
Globulin, Total: 2.4 g/dL (ref 1.5–4.5)
Glucose: 101 mg/dL — ABNORMAL HIGH (ref 70–99)
Potassium: 4.7 mmol/L (ref 3.5–5.2)
Sodium: 144 mmol/L (ref 134–144)
Total Protein: 7 g/dL (ref 6.0–8.5)
eGFR: 92 mL/min/{1.73_m2} (ref >59)

## 2021-07-17 LAB — LIPID PANEL
Chol/HDL Ratio: 7 ratio — ABNORMAL HIGH (ref 0.0–4.4)
Cholesterol, Total: 274 mg/dL — ABNORMAL HIGH (ref 100–199)
HDL: 39 mg/dL — ABNORMAL LOW (ref >39)
LDL Chol Calc (NIH): 189 mg/dL — ABNORMAL HIGH (ref 0–99)
Triglycerides: 237 mg/dL — ABNORMAL HIGH (ref 0–149)
VLDL Cholesterol Cal: 46 mg/dL — ABNORMAL HIGH (ref 5–40)

## 2021-07-17 LAB — SEDIMENTATION RATE: Sed Rate: 3 mm/hr (ref 0–32)

## 2021-07-17 LAB — HEMOGLOBIN A1C
Est. average glucose Bld gHb Est-mCnc: 126 mg/dL
Hgb A1c MFr Bld: 6 % — ABNORMAL HIGH (ref 4.8–5.6)

## 2021-07-17 IMAGING — MR MR HEAD WO/W CM
15 of 26 series · 20 of 48 positions shown · IV contrast (agent unspecified)
Comparison: None.

CLINICAL DATA: Ataxia, nontraumatic, cervical pathology suspected
numbness, gait disturbace; Numbness or tingling, paresthesia (Ped
0-17y)

EXAM:
MRI HEAD WITHOUT AND WITH CONTRAST
MRI CERVICAL SPINE WITHOUT AND WITH CONTRAST
TECHNIQUE: Multiplanar, multiecho pulse sequences of the brain and surrounding
structures and cervical spine were obtained without and with
intravenous contrast.

[Series 2: DWI · axial · 3.6mm · 0.94mm/px · 1 of 80 slices shown (1 of 2)]
[im 1/80]
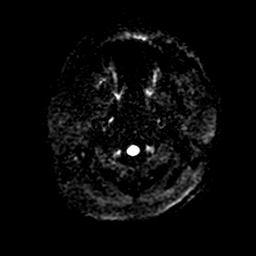

[Series 3: FLAIR · sagittal · 5.0mm · 0.47mm/px · 1 of 23 slices shown (1 of 4)]
[im 1/23]
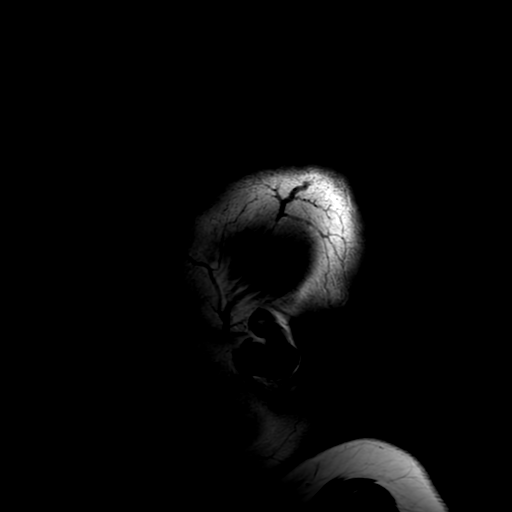

[Series 4: T2 · axial · 5.0mm · 0.23mm/px · 1 of 25 slices shown (1 of 4)]
[im 1/25]
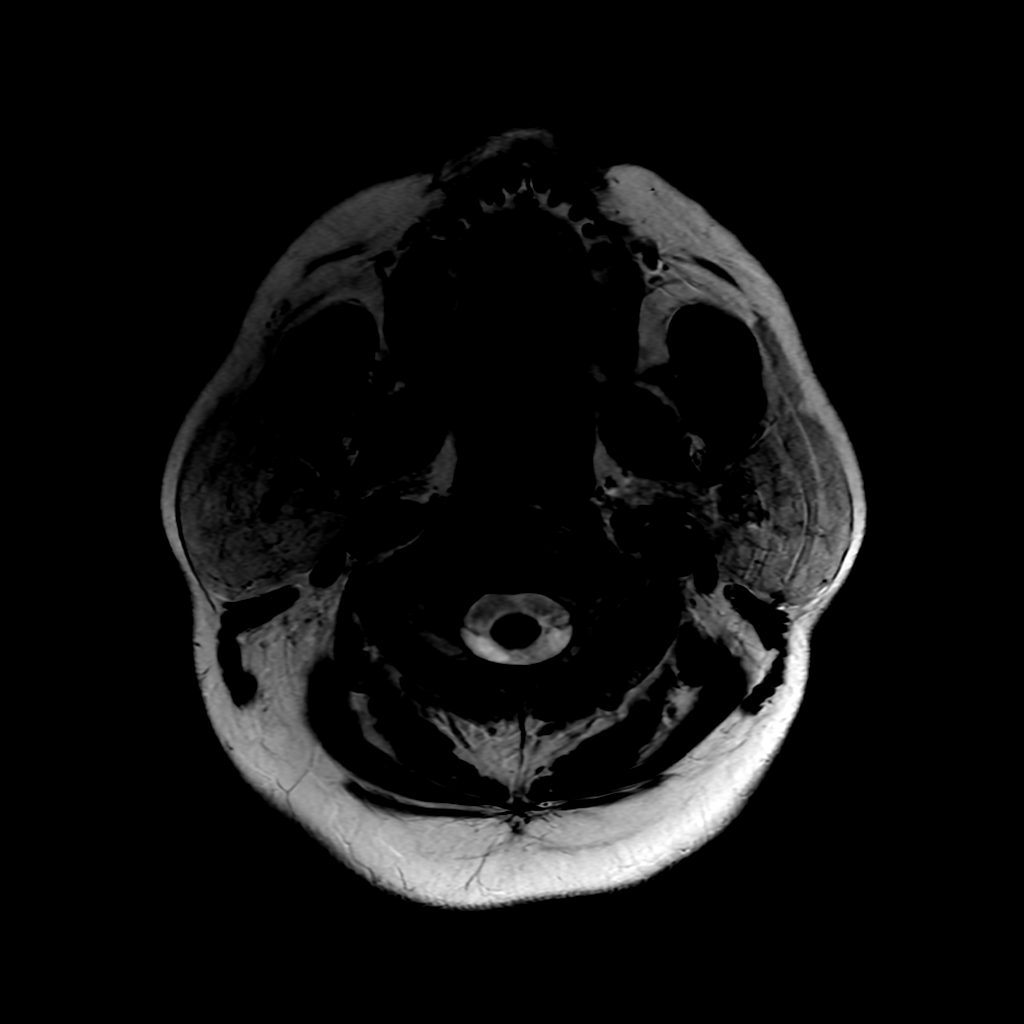

[Series 5: FLAIR · axial · 4.0mm · 0.45mm/px · 1 of 32 slices shown (2 of 4)]
[im 1/32]
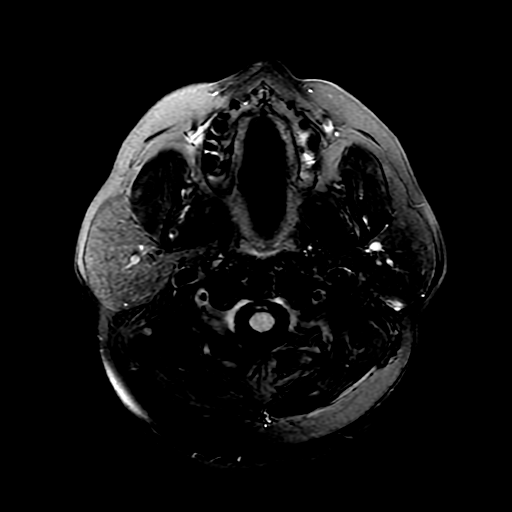

[Series 6: FLAIR · sagittal · 1.6mm · 0.49mm/px · 5 of 212 slices shown (3 of 4)]
[im 1/212]
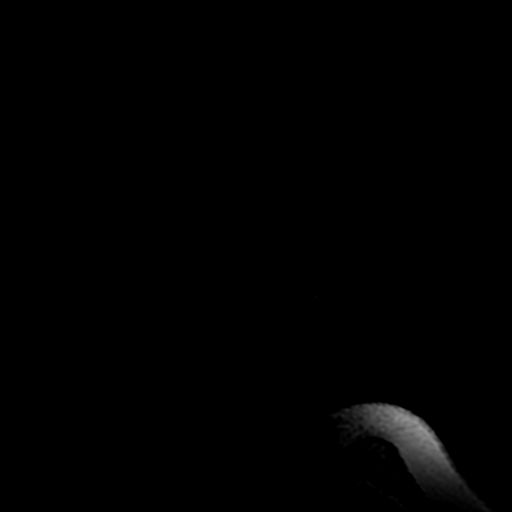
[im 53/212]
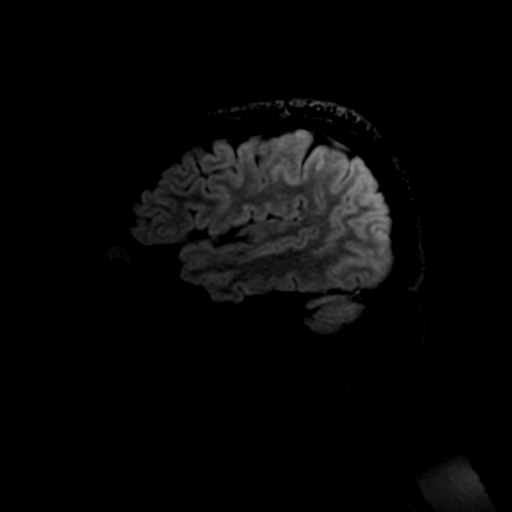
[im 106/212]
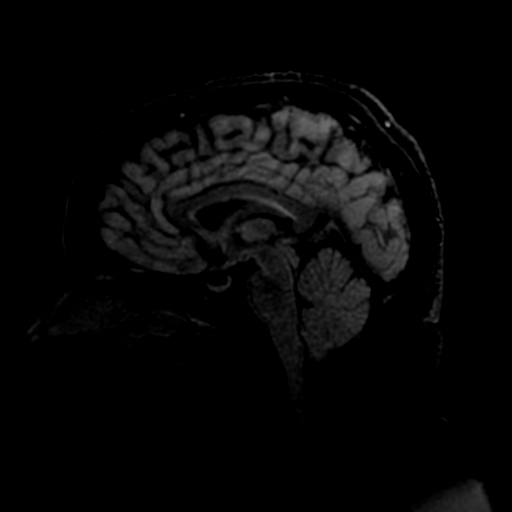
[im 159/212]
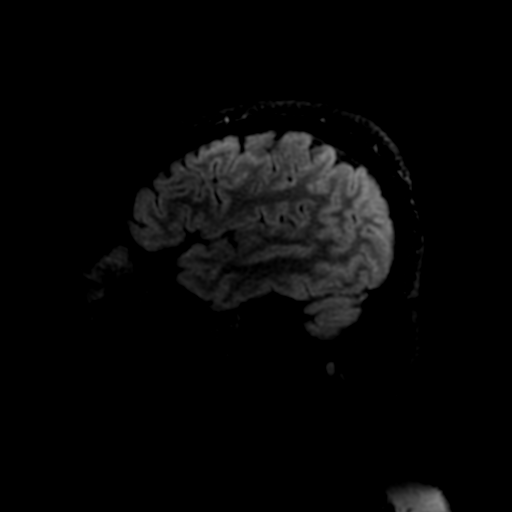
[im 212/212]
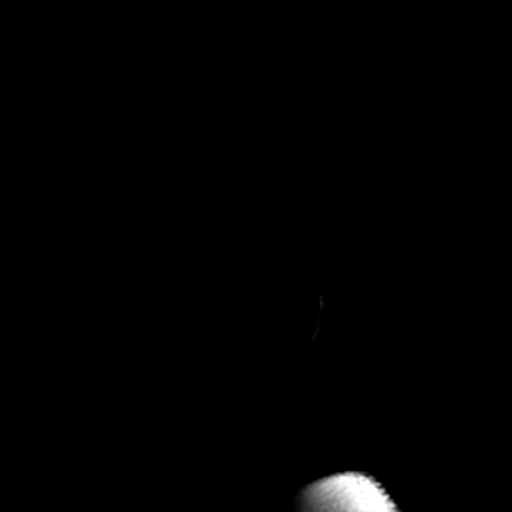

[Series 7: DWI · coronal · 5.0mm · 0.94mm/px · 2 of 68 slices shown (2 of 2)]
[im 1/68]
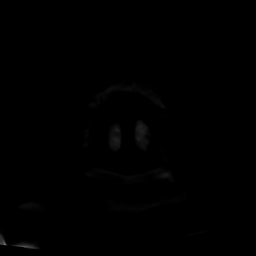
[im 68/68]
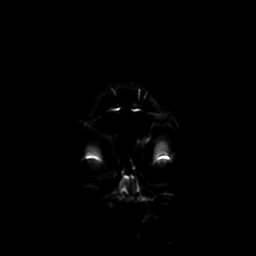

[Series 8: SWI · axial · 2.9mm · 0.47mm/px · 1 of 94 slices shown]
[im 1/94]
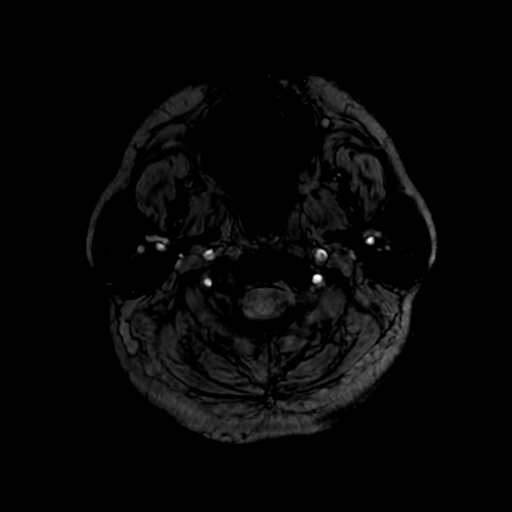

[Series 11: T2 · sagittal · 3.0mm · 0.43mm/px · 1 of 16 slices shown (2 of 4)]
[im 1/16]
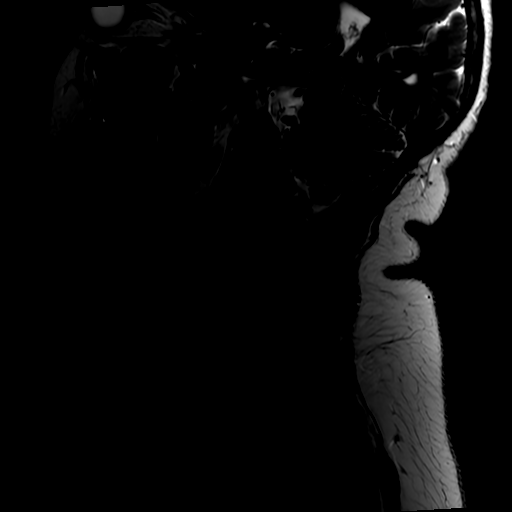

[Series 12: FLAIR · sagittal · 3.0mm · 0.43mm/px · 1 of 16 slices shown (4 of 4)]
[im 1/16]
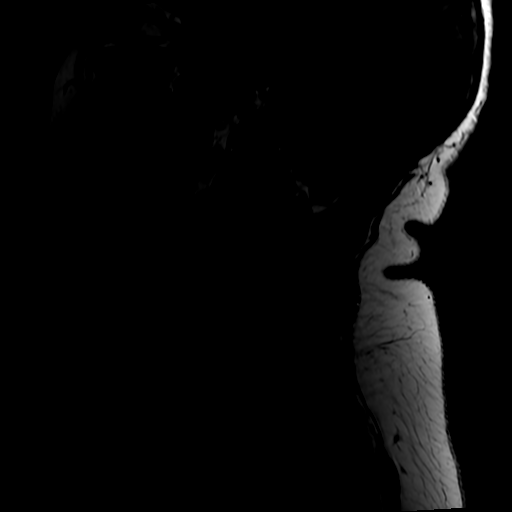

[Series 15: T2 · axial · 3.0mm · 0.35mm/px · 1 of 27 slices shown (3 of 4)]
[im 1/27]
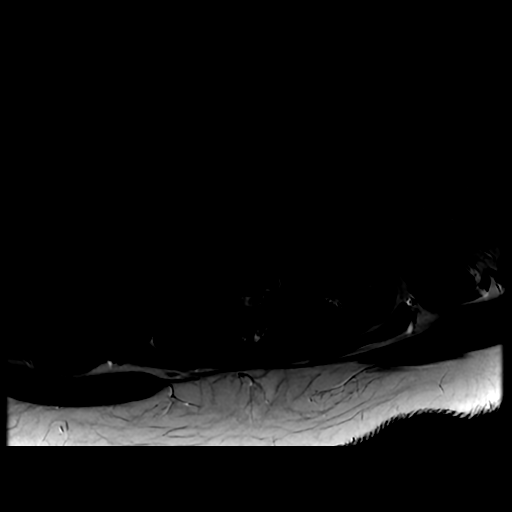

[Series 21: T1 post-contrast · axial · 3.0mm · 0.70mm/px · 1 of 27 slices shown (1 of 2)]
[im 1/27]
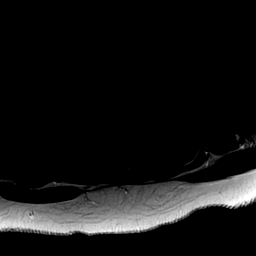

[Series 23: T2 · coronal · 5.0mm · 0.39mm/px · 1 of 29 slices shown (4 of 4)]
[im 1/29]
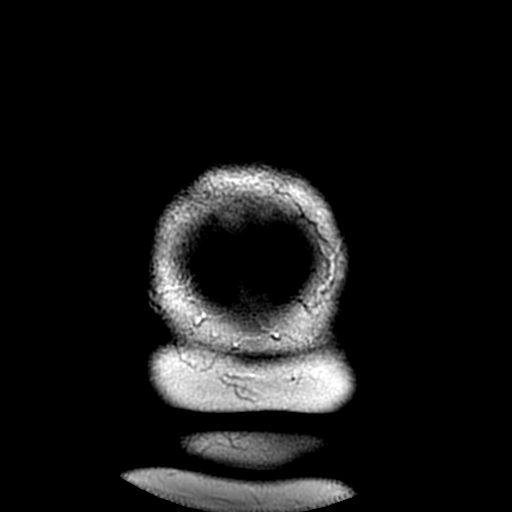

[Series 25: T1 post-contrast · coronal · 5.0mm · 0.43mm/px · 1 of 29 slices shown (2 of 2)]
[im 1/29]
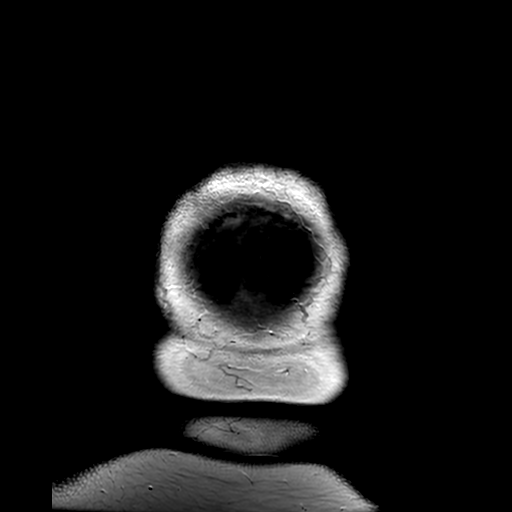

[Series 250: ADC · axial · 3.6mm · 0.94mm/px · 1 of 40 slices shown (1 of 2)]
[im 1/40]
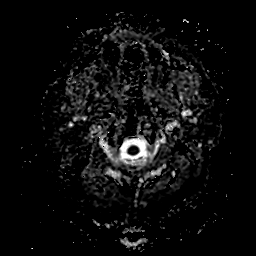

[Series 750: ADC · coronal · 5.0mm · 0.94mm/px · 1 of 34 slices shown (2 of 2)]
[im 1/34]
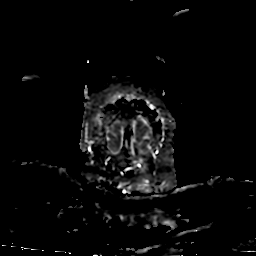

[20 of 48 positions shown; findings below may reference images not displayed]

FINDINGS: MRI HEAD FINDINGS

Mildly motion limited study due to respiratory motion which is not
preventable on this sedated study.

Brain: No acute infarction, hemorrhage, hydrocephalus, extra-axial
collection or mass lesion Mild sulcal FLAIR hyperintensity
posteriorly is favored artifactual given relative overlying
hyperintensity of the scalp suggesting artifact. No substantial
white matter disease. No abnormal enhancement.

Vascular: Major arterial flow voids are maintained at the skull
base.

Skull and upper cervical spine: Normal marrow signal.

Sinuses/Orbits: Clear sinuses.  Unremarkable orbits.

Other: No mastoid effusions.

MRI CERVICAL SPINE FINDINGS

Mild to moderately motion limited study due to respiratory motion
which is not preventable on this sedated study.

Alignment: Straightening of the normal cervical lordosis.

Vertebrae: Vertebral body heights are maintained. No focal marrow
edema chest acute fracture discitis/osteomyelitis.

Cord: Normal cord signal.  No abnormal enhancement.

Posterior Fossa, vertebral arteries, paraspinal tissues: Visualized
vertebral artery flow voids are maintained.

Disc levels: No significant disc protrusion, foraminal stenosis, or
canal stenosis.
IMPRESSION: MRI head:

Normal appearance of the brain for age. No substantial white matter
disease or evidence of acute abnormality.

MRI cervical spine:

1. Motion limited study without evidence of abnormal cord signal or
enhancement.
2. No stenosis.

## 2021-07-17 IMAGING — MR MR CERVICAL SPINE WO/W CM
5 of 9 series · 22 of 48 positions shown · IV contrast (agent unspecified)
Comparison: None.

CLINICAL DATA: Ataxia, nontraumatic, cervical pathology suspected
numbness, gait disturbace; Numbness or tingling, paresthesia (Ped
0-17y)

EXAM:
MRI HEAD WITHOUT AND WITH CONTRAST
MRI CERVICAL SPINE WITHOUT AND WITH CONTRAST
TECHNIQUE: Multiplanar, multiecho pulse sequences of the brain and surrounding
structures and cervical spine were obtained without and with
intravenous contrast.

[Series 11: T2 · sagittal · 3.0mm · 0.43mm/px · 4 of 16 slices shown (1 of 2)]
[im 1/16]
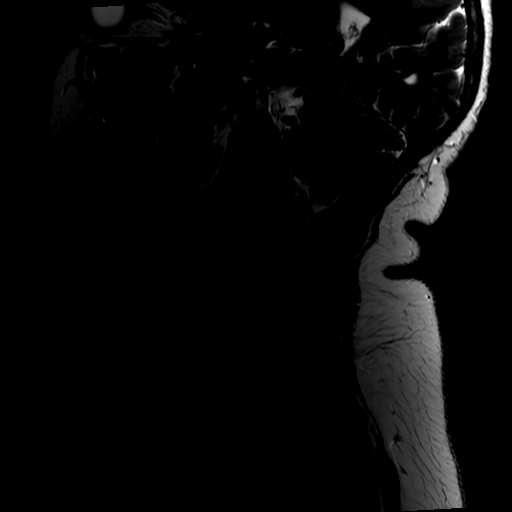
[im 6/16]
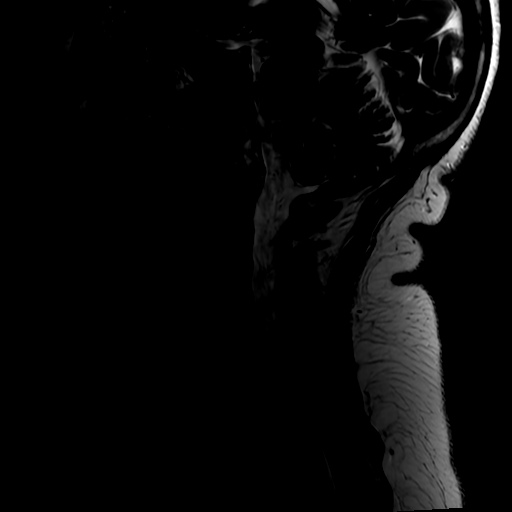
[im 11/16]
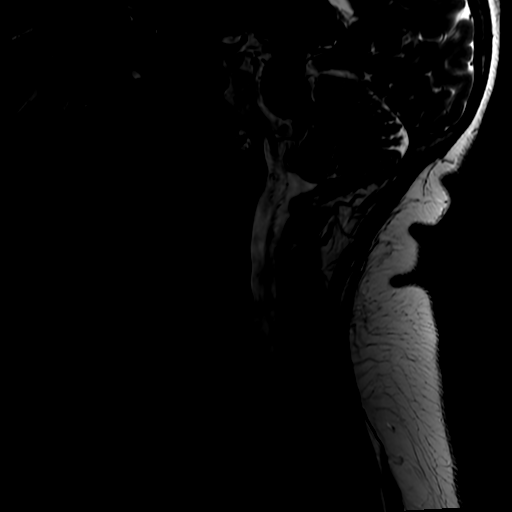
[im 16/16]
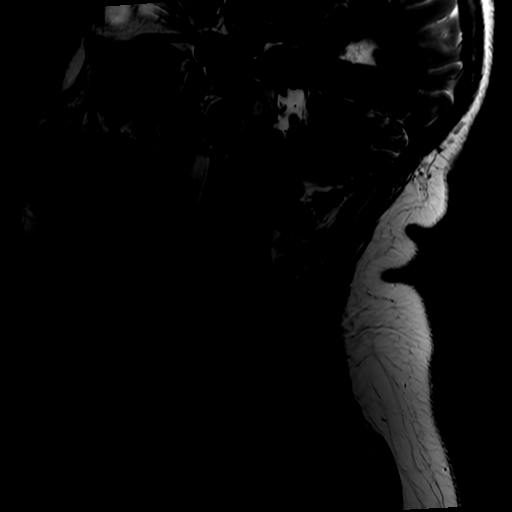

[Series 15: T2 · axial · 3.0mm · 0.35mm/px · z∈[-198,-115]mm · 5 of 27 slices shown (2 of 2)]
[im 1/27]
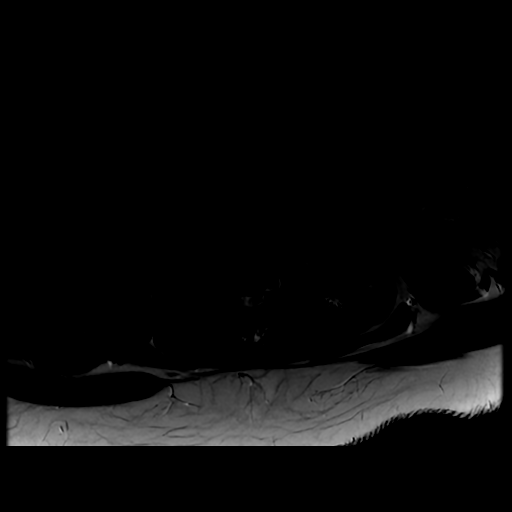
[im 7/27]
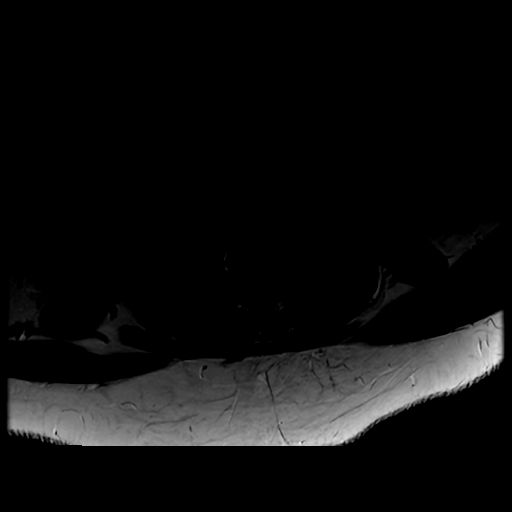
[im 14/27]
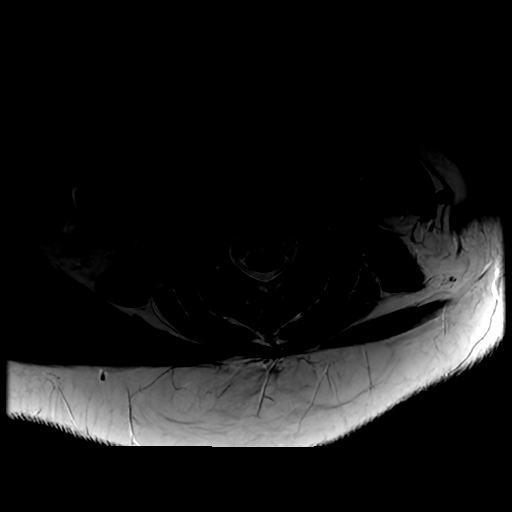
[im 20/27]
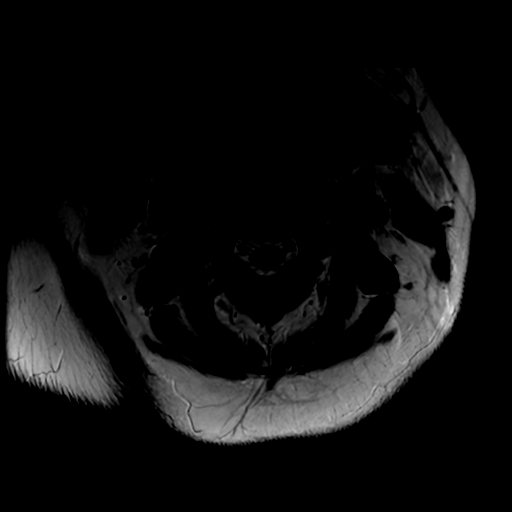
[im 27/27]
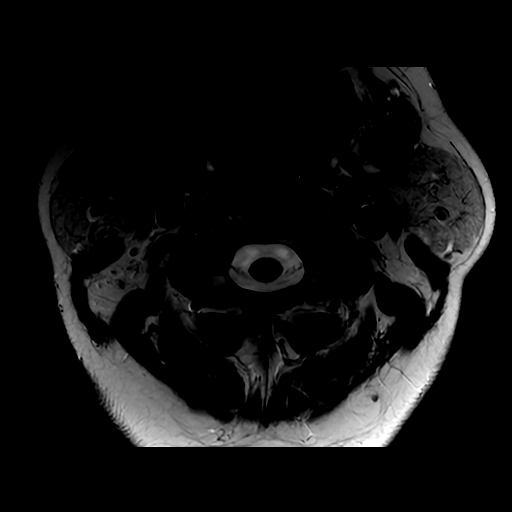

[Series 17: T1 · axial · non-contrast · 3.0mm · 0.35mm/px · z∈[-198,-115]mm · 5 of 27 slices shown (1 of 2)]
[im 1/27]
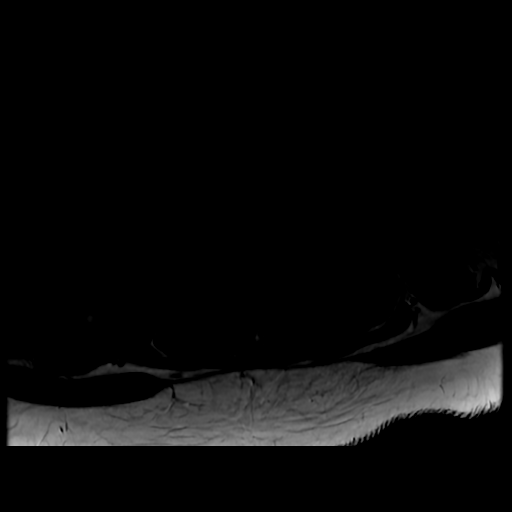
[im 7/27]
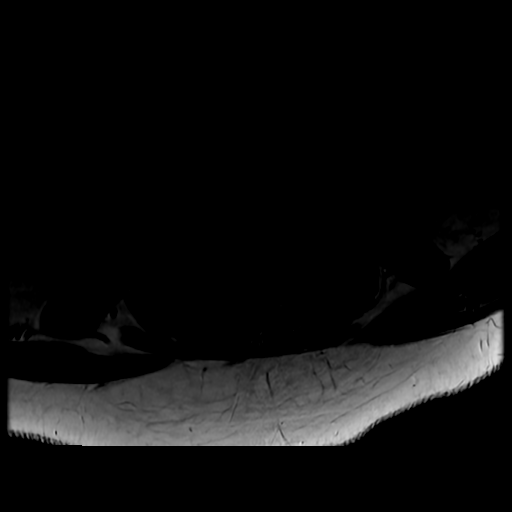
[im 14/27]
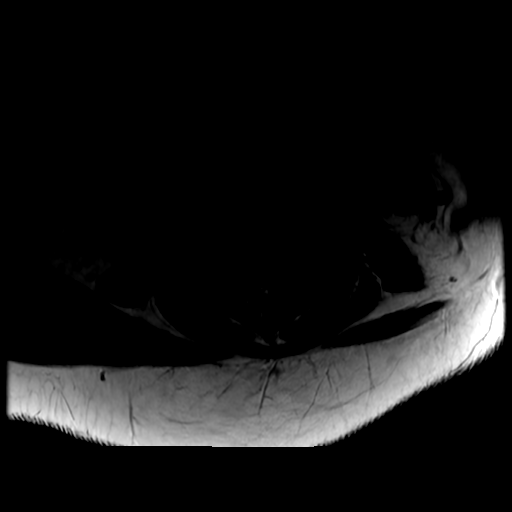
[im 20/27]
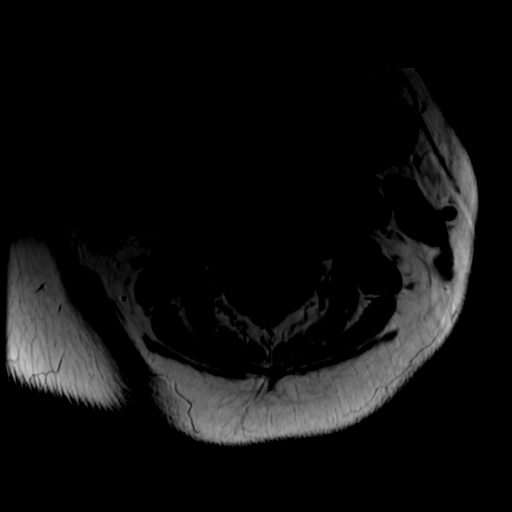
[im 27/27]
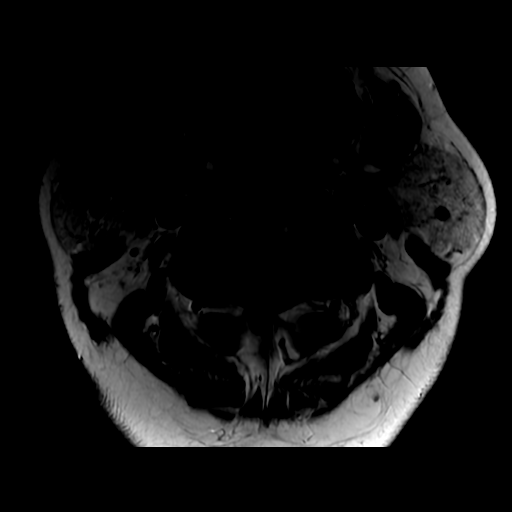

[Series 18: T1 · axial · 3.0mm · 0.70mm/px · z∈[-198,-115]mm · 5 of 27 slices shown (2 of 2)]
[im 1/27]
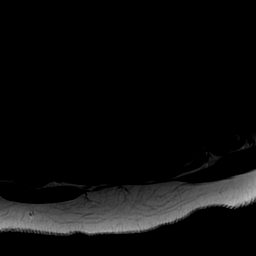
[im 7/27]
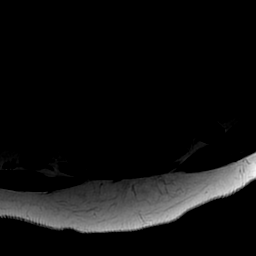
[im 14/27]
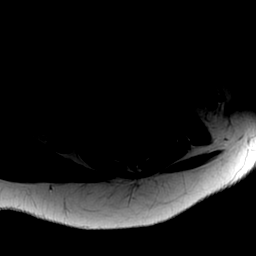
[im 20/27]
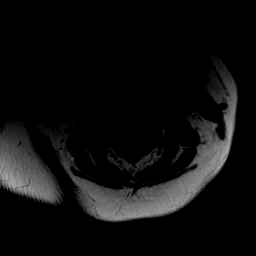
[im 27/27]
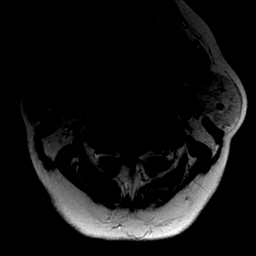

[Series 19: T1 fat-sat post-contrast · sagittal · 3.0mm · 0.43mm/px · 3 of 16 slices shown]
[im 1/16]
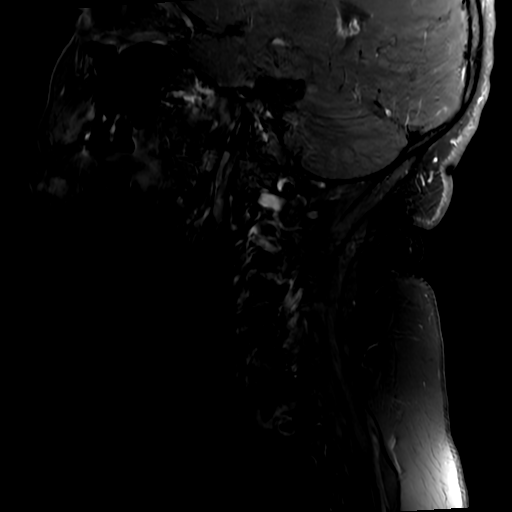
[im 8/16]
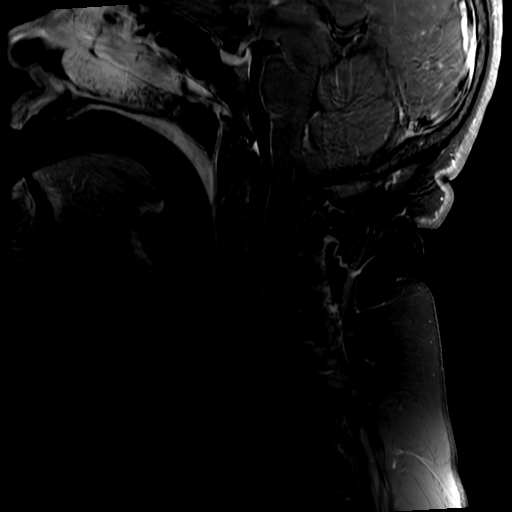
[im 16/16]
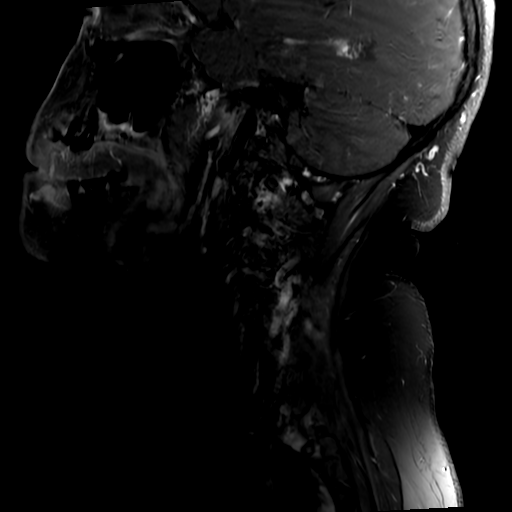

[22 of 48 positions shown; findings below may reference images not displayed]

FINDINGS: MRI HEAD FINDINGS

Mildly motion limited study due to respiratory motion which is not
preventable on this sedated study.

Brain: No acute infarction, hemorrhage, hydrocephalus, extra-axial
collection or mass lesion Mild sulcal FLAIR hyperintensity
posteriorly is favored artifactual given relative overlying
hyperintensity of the scalp suggesting artifact. No substantial
white matter disease. No abnormal enhancement.

Vascular: Major arterial flow voids are maintained at the skull
base.

Skull and upper cervical spine: Normal marrow signal.

Sinuses/Orbits: Clear sinuses.  Unremarkable orbits.

Other: No mastoid effusions.

MRI CERVICAL SPINE FINDINGS

Mild to moderately motion limited study due to respiratory motion
which is not preventable on this sedated study.

Alignment: Straightening of the normal cervical lordosis.

Vertebrae: Vertebral body heights are maintained. No focal marrow
edema chest acute fracture discitis/osteomyelitis.

Cord: Normal cord signal.  No abnormal enhancement.

Posterior Fossa, vertebral arteries, paraspinal tissues: Visualized
vertebral artery flow voids are maintained.

Disc levels: No significant disc protrusion, foraminal stenosis, or
canal stenosis.
IMPRESSION: MRI head:

Normal appearance of the brain for age. No substantial white matter
disease or evidence of acute abnormality.

MRI cervical spine:

1. Motion limited study without evidence of abnormal cord signal or
enhancement.
2. No stenosis.

## 2021-07-17 SURGERY — MRI WITH ANESTHESIA
Anesthesia: General

## 2021-07-17 MED ORDER — PROPOFOL 10 MG/ML IV BOLUS
INTRAVENOUS | Status: DC | PRN
  Administered 2021-07-17: 160 mg via INTRAVENOUS

## 2021-07-17 MED ORDER — DEXAMETHASONE SODIUM PHOSPHATE 10 MG/ML IJ SOLN
INTRAMUSCULAR | Status: DC | PRN
  Administered 2021-07-17: 5 mg via INTRAVENOUS

## 2021-07-17 MED ORDER — MIDAZOLAM HCL 5 MG/5ML IJ SOLN
INTRAMUSCULAR | Status: DC | PRN
  Administered 2021-07-17 (×2): 1 mg via INTRAVENOUS

## 2021-07-17 MED ORDER — CHLORHEXIDINE GLUCONATE 0.12 % MT SOLN
15.0000 mL | Freq: Once | OROMUCOSAL | Status: AC
  Administered 2021-07-17: 15 mL via OROMUCOSAL
  Filled 2021-07-17: qty 15

## 2021-07-17 MED ORDER — FENTANYL CITRATE (PF) 100 MCG/2ML IJ SOLN
INTRAMUSCULAR | Status: DC | PRN
  Administered 2021-07-17 (×2): 25 ug via INTRAVENOUS

## 2021-07-17 MED ORDER — ONDANSETRON HCL 4 MG/2ML IJ SOLN: 4.0000 mg | Freq: Once | INTRAMUSCULAR | Status: DC | PRN

## 2021-07-17 MED ORDER — ONDANSETRON HCL 4 MG/2ML IJ SOLN
INTRAMUSCULAR | Status: DC | PRN
  Administered 2021-07-17: 4 mg via INTRAVENOUS

## 2021-07-17 MED ORDER — GADOBUTROL 1 MMOL/ML IV SOLN
9.0000 mL | Freq: Once | INTRAVENOUS | Status: AC | PRN
  Administered 2021-07-17: 9 mL via INTRAVENOUS

## 2021-07-17 MED ORDER — ORAL CARE MOUTH RINSE: 15.0000 mL | Freq: Once | OROMUCOSAL | Status: AC

## 2021-07-17 MED ORDER — LACTATED RINGERS IV SOLN: INTRAVENOUS | Status: DC

## 2021-07-17 MED ORDER — LIDOCAINE 2% (20 MG/ML) 5 ML SYRINGE
INTRAMUSCULAR | Status: DC | PRN
  Administered 2021-07-17: 80 mg via INTRAVENOUS

## 2021-07-17 MED ORDER — FENTANYL CITRATE (PF) 100 MCG/2ML IJ SOLN: 25.0000 ug | INTRAMUSCULAR | Status: DC | PRN

## 2021-07-17 MED ORDER — FENTANYL CITRATE (PF) 250 MCG/5ML IJ SOLN: INTRAMUSCULAR | Status: DC | PRN

## 2021-07-17 NOTE — H&P (Signed)
Anesthesia H&P Update: History and Physical Exam reviewed; patient is OK for planned anesthetic and procedure.  H&P 07/16/21

## 2021-07-17 NOTE — Transfer of Care (Signed)
Immediate Anesthesia Transfer of Care Note  Patient: Kimberly Cabrera  Procedure(s) Performed: MRI WITH ANESTHESIA BRAIN WITH AND WITHOUT CONTRAST, MRI CERVICAL SPINE WITH AND WITHOUT CONTRAST  Patient Location: PACU  Anesthesia Type:General  Level of Consciousness: drowsy and patient cooperative  Airway & Oxygen Therapy: Patient Spontanous Breathing and Patient connected to nasal cannula oxygen  Post-op Assessment: Report given to RN and Post -op Vital signs reviewed and stable  Post vital signs: Reviewed and stable  Last Vitals:  Vitals Value Taken Time  BP 128/84 07/17/21 0951  Temp    Pulse 102 07/17/21 0952  Resp 17 07/17/21 0952  SpO2 99 % 07/17/21 0952  Vitals shown include unvalidated device data.  Last Pain:  Vitals:   07/17/21 0642  TempSrc:   PainSc: 4       Patients Stated Pain Goal: 0 (07/17/21 9643)  Complications: No notable events documented.

## 2021-07-17 NOTE — Anesthesia Procedure Notes (Signed)
Procedure Name: LMA Insertion Date/Time: 07/17/2021 8:15 AM Performed by: Audie Pinto, CRNA Pre-anesthesia Checklist: Patient identified, Emergency Drugs available, Suction available and Patient being monitored Patient Re-evaluated:Patient Re-evaluated prior to induction Oxygen Delivery Method: Circle system utilized Preoxygenation: Pre-oxygenation with 100% oxygen Induction Type: IV induction LMA: LMA inserted LMA Size: 4.0 Placement Confirmation: positive ETCO2 and breath sounds checked- equal and bilateral Dental Injury: Teeth and Oropharynx as per pre-operative assessment

## 2021-07-17 NOTE — Anesthesia Postprocedure Evaluation (Signed)
Anesthesia Post Note  Patient: Kimberly Cabrera  Procedure(s) Performed: MRI WITH ANESTHESIA BRAIN WITH AND WITHOUT CONTRAST, MRI CERVICAL SPINE WITH AND WITHOUT CONTRAST     Patient location during evaluation: PACU Anesthesia Type: General Level of consciousness: awake and alert Pain management: pain level controlled Vital Signs Assessment: post-procedure vital signs reviewed and stable Respiratory status: spontaneous breathing, nonlabored ventilation and respiratory function stable Cardiovascular status: blood pressure returned to baseline and stable Postop Assessment: no apparent nausea or vomiting Anesthetic complications: no   No notable events documented.  Last Vitals:  Vitals:   07/17/21 1005 07/17/21 1020  BP: 124/90 124/87  Pulse: 94 95  Resp: 15 17  Temp:  36.6 C  SpO2: 96% 93%    Last Pain:  Vitals:   07/17/21 1020  TempSrc:   PainSc: 0-No pain                 Collene Schlichter

## 2021-07-18 ENCOUNTER — Encounter (HOSPITAL_COMMUNITY): Payer: Self-pay | Admitting: Radiology

## 2021-07-18 DIAGNOSIS — R7303 Prediabetes: Secondary | ICD-10-CM | POA: Insufficient documentation

## 2021-07-18 DIAGNOSIS — E1169 Type 2 diabetes mellitus with other specified complication: Secondary | ICD-10-CM | POA: Insufficient documentation

## 2021-07-18 DIAGNOSIS — R700 Elevated erythrocyte sedimentation rate: Secondary | ICD-10-CM | POA: Insufficient documentation

## 2021-07-18 DIAGNOSIS — J3489 Other specified disorders of nose and nasal sinuses: Secondary | ICD-10-CM | POA: Insufficient documentation

## 2021-07-18 DIAGNOSIS — R220 Localized swelling, mass and lump, head: Secondary | ICD-10-CM | POA: Insufficient documentation

## 2021-07-18 DIAGNOSIS — R739 Hyperglycemia, unspecified: Secondary | ICD-10-CM | POA: Insufficient documentation

## 2021-07-18 NOTE — Assessment & Plan Note (Signed)
Management per specialist. 

## 2021-07-18 NOTE — Assessment & Plan Note (Signed)
Reassurance given.  

## 2021-07-18 NOTE — Assessment & Plan Note (Signed)
Bactrim ds rx

## 2021-07-18 NOTE — Assessment & Plan Note (Addendum)
>>  ASSESSMENT AND PLAN FOR COMBINED HYPERLIPIDEMIA ASSOCIATED WITH TYPE 2 DIABETES MELLITUS (HCC) WRITTEN ON 04/27/2023 10:13 PM BY LEAL-BORJAS, Akayla Brass I, CMA  >>ASSESSMENT AND PLAN FOR ELEVATED BLOOD SUGAR WRITTEN ON 07/18/2021  5:55 PM BY COX, KIRSTEN, MD  Check a1c.  Recommend continue to work on eating healthy diet and exercise.    >>ASSESSMENT AND PLAN FOR MIXED HYPERLIPIDEMIA WRITTEN ON 07/18/2021  5:57 PM BY COX, KIRSTEN, MD  LDL and trigs high. Patient agreed to take crestor 5 mg before bed. I anticipate need for more.  Continue to work on eating a healthy diet and exercise.  Labs drawn today.

## 2021-07-18 NOTE — Assessment & Plan Note (Signed)
Repeat esr.

## 2021-07-18 NOTE — Assessment & Plan Note (Signed)
Check a1c.  Recommend continue to work on eating healthy diet and exercise.  

## 2021-07-18 NOTE — Assessment & Plan Note (Signed)
The current medical regimen is effective;  continue present plan and medications. Sees psychiatry.

## 2021-07-18 NOTE — Assessment & Plan Note (Signed)
LDL and trigs high. Patient agreed to take crestor 5 mg before bed. I anticipate need for more.  Continue to work on eating a healthy diet and exercise.  Labs drawn today.

## 2021-07-19 LAB — SPECIMEN STATUS REPORT

## 2021-07-19 LAB — VITAMIN D 25 HYDROXY (VIT D DEFICIENCY, FRACTURES): Vit D, 25-Hydroxy: 26.7 ng/mL — ABNORMAL LOW (ref 30.0–100.0)

## 2021-07-24 ENCOUNTER — Telehealth: Payer: Self-pay

## 2021-07-24 NOTE — Telephone Encounter (Signed)
Patient will take crestor 10 mg with the 5 mg supply she currently has. She questions if there is a liquid form or alternative for the vascepa capsules as she knows she will have troubles swallowing that big of a capsule.   Terrill Mohr 07/24/21 3:23 PM

## 2021-07-24 NOTE — Telephone Encounter (Signed)
Spoke with Husband, he will make patient aware. She is to give call back if thi sis ok.   Lorita Officer, [place] 07/24/21 4:55 PM

## 2021-07-30 LAB — HM COLONOSCOPY

## 2021-08-06 NOTE — H&P (Signed)
See H & P from 06/20/2021 Despina Arias, MD, PhD

## 2021-08-13 ENCOUNTER — Other Ambulatory Visit: Payer: Self-pay | Admitting: Nurse Practitioner

## 2021-08-13 DIAGNOSIS — E559 Vitamin D deficiency, unspecified: Secondary | ICD-10-CM

## 2021-08-14 ENCOUNTER — Ambulatory Visit: Payer: No Typology Code available for payment source | Admitting: Nurse Practitioner

## 2021-09-23 ENCOUNTER — Other Ambulatory Visit: Payer: Self-pay | Admitting: Nurse Practitioner

## 2021-09-23 DIAGNOSIS — E559 Vitamin D deficiency, unspecified: Secondary | ICD-10-CM

## 2021-10-04 ENCOUNTER — Telehealth: Payer: Self-pay

## 2021-10-04 NOTE — Telephone Encounter (Signed)
I left a message on the number(s) listed in the patients chart requesting the patient to call back regarding the upcoming appointment for 10/19/2021. The provider is out of the office that day. The appointment has been canceled. Waiting for the patient to return the call.  ? ?Per Dr. Cox the appointment can be rescheduled to 10/17/2021 in the afternoon. ?

## 2021-10-04 NOTE — Telephone Encounter (Signed)
My chart message sent to pt.

## 2021-10-19 ENCOUNTER — Ambulatory Visit: Payer: No Typology Code available for payment source | Admitting: Family Medicine

## 2021-11-02 ENCOUNTER — Ambulatory Visit: Payer: No Typology Code available for payment source | Admitting: Family Medicine

## 2021-11-14 ENCOUNTER — Other Ambulatory Visit: Payer: Self-pay | Admitting: Nurse Practitioner

## 2021-11-14 DIAGNOSIS — E559 Vitamin D deficiency, unspecified: Secondary | ICD-10-CM

## 2021-11-16 ENCOUNTER — Encounter: Payer: Self-pay | Admitting: Neurology

## 2021-11-19 ENCOUNTER — Encounter: Payer: Self-pay | Admitting: Neurology

## 2021-11-19 ENCOUNTER — Ambulatory Visit (INDEPENDENT_AMBULATORY_CARE_PROVIDER_SITE_OTHER): Payer: No Typology Code available for payment source | Admitting: Neurology

## 2021-11-19 VITALS — BP 125/92 | HR 119 | Ht 63.0 in | Wt 212.0 lb

## 2021-11-19 DIAGNOSIS — G8929 Other chronic pain: Secondary | ICD-10-CM

## 2021-11-19 DIAGNOSIS — M542 Cervicalgia: Secondary | ICD-10-CM

## 2021-11-19 DIAGNOSIS — G4719 Other hypersomnia: Secondary | ICD-10-CM | POA: Diagnosis not present

## 2021-11-19 DIAGNOSIS — R519 Headache, unspecified: Secondary | ICD-10-CM | POA: Diagnosis not present

## 2021-11-19 MED ORDER — RIZATRIPTAN BENZOATE 10 MG PO TBDP: 10.0000 mg | ORAL_TABLET | ORAL | 11 refills | Status: DC | PRN

## 2021-11-19 MED ORDER — IMIPRAMINE HCL 25 MG PO TABS: 50.0000 mg | ORAL_TABLET | Freq: Every day | ORAL | 11 refills | Status: DC

## 2021-11-19 NOTE — Progress Notes (Signed)
[facility]  PATIENT: Kimberly Cabrera DOB: 11-10-1975  REFERRING DOCTOR OR PCP: Blane Ohara, MD SOURCE: Notes from primary care, rheumatology  _________________________________   HISTORICAL  CHIEF COMPLAINT:  Chief Complaint  Patient presents with   Headache    RM 2, with husband. C/o severe HA since Friday.    HISTORY OF PRESENT ILLNESS:  At the pleasure of seeing patient, Jayana Schalow, at [facility] for neurologic consultation regarding her fatigue, headaches and other symptoms.  Update 11/19/2021:  Headaches had been doing very well on imipramine but for the last 4 days, she has had a severe migraine with HA, neck pain, Nausea, photophobia/phonophobia.    She took phenergan and Zofran and had no vomiting.      Since last visit, she did have MRI of the brain and cervical spine 07/17/2021.  The brain was normal for age with just 2 punctate T2/FLAIR hyperintense foci.  The cervical spine just showed very minimal disc bulges.  No spinal stenosis or nerve root compression or significant bony changes  Unlikely headache in the past, she is not experiencing facial numbness.  CT scan of the head that she had in 2022 was normal.  She had been unable to obtain an MRI due to a panic attack.  She is scheduled to have conscious sedation for an MRI of the brain and cervical spine at the hospital.   Her tremor has resolved.  Since the tick bite she also has had constant pain in her legs and hips.  She saw rheumatology but no rheumatologic issue was discovered.  Labs showed mildly elevated ESR but the CRP was normal.  ANA was normal.  History of neurologic symptoms: She has had neurologic symptoms and severe fatigue since a tick bite 09/2019.  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   She did a two week course of doxycycline.   The fatigue never improved.   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     She physically feels poor.      She wakes up a couple times every night.   She snores.  Husband has not noted OSA signs   She has gained 50 pounds in last few years.  She has mild excessive daytime sleepiness.  She has not yet done the home sleep study.  EPWORTH SLEEPINESS SCALE  On a scale of 0 - 3 what is the chance of dozing:  Sitting and Reading:   3 Watching TV:    3 Sitting inactive in a public place: 0 Passenger in car for one hour: 0 Lying down to rest in the afternoon: 3 Sitting and talking to someone: 0 Sitting quietly after lunch:  3 In a car, stopped in traffic:  0  Total (out of 24):   12/24 mild EDS.      Data: 03/21/2021:  B12 was normal.  Vit D was 13.2 (taking 91478 weekly suppl), elevated cholesterol = 273, LDL = 186.   CMP, CBC/D were ok.    05/29/2021: ESR was mildly elevated (39), CRP, ANA were normal.  01/11/2021: CT scan was normal.  MRI of the brain and cervical spine 07/17/2021.  The brain was normal for age with just 2 punctate T2/FLAIR hyperintense foci.  The cervical spine just showed very minimal disc bulges.  No spinal stenosis or nerve root compression or significant bony changes  Vascular risk factors:   She  stopped smoking (she was 1ppd x 15).  She has hyperlipidemia and has changed her diet and started Crestor.  She does not have hypertension or diabetes.   REVIEW OF SYSTEMS: Constitutional: No fevers, chills, sweats, or change in appetite Eyes: No visual changes, double vision, eye pain Ear, nose and throat: No hearing loss, ear pain, nasal congestion, sore throat Cardiovascular: No chest pain, palpitations Respiratory:  No shortness of breath at rest or with exertion.   No wheezes GastrointestinaI: No nausea, vomiting, diarrhea, abdominal pain, fecal incontinence Genitourinary:  No dysuria, urinary retention or frequency.  No nocturia. Musculoskeletal:  No neck pain, back pain Integumentary: No rash, pruritus, skin lesions Neurological: as  above Psychiatric: No depression at this time.  No anxiety Endocrine: No palpitations, diaphoresis, change in appetite, change in weigh or increased thirst Hematologic/Lymphatic:  No anemia, purpura, petechiae. Allergic/Immunologic: No itchy/runny eyes, nasal congestion, recent allergic reactions, rashes  ALLERGIES: Allergies  Allergen Reactions   Cefdinir Diarrhea    HOME MEDICATIONS:  Current Outpatient Medications:    albuterol (VENTOLIN HFA) 108 (90 Base) MCG/ACT inhaler, Inhale 2 puffs into the lungs every 6 (six) hours as needed for wheezing., Disp: , Rfl:    Aspirin-Acetaminophen (GOODYS BODY PAIN PO), Take 1 Package by mouth in the morning and at bedtime., Disp: , Rfl:    FLUoxetine (PROZAC) 10 MG capsule, Take 10 mg by mouth See admin instructions. Take with 40 ng cap for a total of 50 mg daily, Disp: , Rfl:    FLUoxetine (PROZAC) 40 MG capsule, Take 40 mg by mouth See admin instructions. Take with 10 mg for a total of 50 mg daily, Disp: , Rfl:    LORazepam (ATIVAN) 0.5 MG tablet, Take 0.5 mg by mouth 3 (three) times daily as needed for anxiety., Disp: , Rfl:    ondansetron (ZOFRAN) 4 MG tablet, Take 4 mg by mouth every 6 (six) hours as needed for nausea or vomiting., Disp: , Rfl:    promethazine (PHENERGAN) 25 MG tablet, Take 25 mg by mouth every 8 (eight) hours as needed for nausea or vomiting., Disp: , Rfl:    rizatriptan (MAXALT-MLT) 10 MG disintegrating tablet, Take 1 tablet (10 mg total) by mouth as needed for migraine. May repeat in 2 hours if needed, Disp: 9 tablet, Rfl: 11   Vitamin D, Ergocalciferol, (DRISDOL) 1.25 MG (50000 UNIT) CAPS capsule, TAKE 1 CAPSULE BY MOUTH EVERY 7 DAYS, Disp: 5 capsule, Rfl: 0   imipramine (TOFRANIL) 25 MG tablet, Take 2 tablets (50 mg total) by mouth at bedtime., Disp: 60 tablet, Rfl: 11  PAST MEDICAL HISTORY: Past Medical History:  Diagnosis Date   Anxiety    Asthma    seasonal asthma   Depression    no current problem   GERD  (gastroesophageal reflux disease)    occasional - otc med prn   Headache    History of kidney stones    surgery to remove   Hyperlipidemia    no meds, diet controlled    PAST SURGICAL HISTORY: Past Surgical History:  Procedure Laterality Date   ABDOMINAL HYSTERECTOMY     46 yo. Total.   APPENDECTOMY     ECTOPIC PREGNANCY SURGERY N/A    laparoscopic   RADIOLOGY WITH ANESTHESIA N/A 07/17/2021   Procedure: MRI WITH ANESTHESIA BRAIN WITH AND WITHOUT CONTRAST, MRI CERVICAL SPINE WITH AND WITHOUT CONTRAST;  Surgeon: Radiologist, Medication, MD;  Location: [facility] OR;  Service: Radiology;  Laterality: N/A;   removal kidney stone  TONSILLECTOMY     WISDOM TOOTH EXTRACTION      FAMILY HISTORY: Family History  Problem Relation Age of Onset   Hyperlipidemia Mother    Diabetes Mother    Hyperlipidemia Father    Diabetes Father    Heart disease Father    Dementia Father    Melanoma Father    Melanoma Sister    Diabetes Sister    Hypertension Maternal Grandmother    Mental illness Maternal Grandmother    Asthma Paternal Grandfather    Heart attack Paternal Grandfather    Arthritis Paternal Grandfather     SOCIAL HISTORY:  Social History   Socioeconomic History   Marital status: Married    Spouse name: Lonita Albers   Number of children: Not on file   Years of education: Not on file   Highest education level: Not on file  Occupational History   Occupation: Charity fundraiser    Comment: [employer] ([place])  Tobacco Use   Smoking status: Former    Packs/day: 0.50    Years: 5.00    Total pack years: 2.50    Types: Cigarettes    Quit date: 05/07/2021    Years since quitting: 0.5   Smokeless tobacco: Never   Tobacco comments:    Smokes 2-3 cigarettes a day  Vaping Use   Vaping Use: Never used  Substance and Sexual Activity   Alcohol use: Never   Drug use: Never   Sexual activity: Not Currently    Partners: Male    Birth control/protection: Surgical    Comment: Hysterectomy   Other Topics Concern   Not on file  Social History Narrative   Not on file   Social Determinants of Health   Financial Resource Strain: Not on file  Food Insecurity: Not on file  Transportation Needs: Not on file  Physical Activity: Not on file  Stress: Not on file  Social Connections: Not on file  Intimate Partner Violence: Not on file     PHYSICAL EXAM  Vitals:   11/19/21 1335  BP: (!) 125/92  Pulse: (!) 119  Weight: 212 lb (96.2 kg)  Height: 5\' 3"  (1.6 m)    Body mass index is 37.55 kg/m.  No results found.     General: The patient is well-developed and well-nourished and in no acute distress  HEENT:  Head is [place]/AT.  Sclera are anicteric.  Funduscopic exam shows normal optic discs and retinal vessels.  Neck: Reduced range of motion in neck (improved after injection).  The neck is tender over left more than right occiput.  Also tender over cervical paraspinal muscles.  Cardiovascular: The heart has a regular rate and rhythm with a normal S1 and S2. There were no murmurs, gallops or rubs.    Skin: Extremities are without rash or  edema.  Musculoskeletal:  Back is nontender  Neurologic Exam  Mental status: The patient is alert and oriented x 3 at the time of the examination. The patient has apparent normal recent and remote memory, with an apparently normal attention span and concentration ability.   Speech is normal.  Cranial nerves: Extraocular movements are full.  Facial strength and sensation was normal.  No obvious hearing deficits are noted.  Motor:  Muscle bulk is normal.   Tone is normal. Strength is  5 / 5 in all 4 extremities.   Coordination: Cerebellar testing reveals good finger-nose-finger and heel-to-shin bilaterally.  Gait and station: Station is normal.   Gait is normal. Tandem gait is  mildly wide.    Reflexes: Deep tendon reflexes are symmetric and normal, 2 in arms and 3 in legs.       DIAGNOSTIC DATA (LABS, IMAGING, TESTING) - I  reviewed patient records, labs, notes, testing and imaging myself where available.  Lab Results  Component Value Date   WBC 6.2 03/21/2021   HGB 14.7 03/21/2021   HCT 44.0 03/21/2021   MCV 86 03/21/2021   PLT 276 03/21/2021      Component Value Date/Time   NA 144 07/16/2021 0805   K 4.7 07/16/2021 0805   CL 104 07/16/2021 0805   CO2 24 07/16/2021 0805   GLUCOSE 101 (H) 07/16/2021 0805   BUN 6 07/16/2021 0805   CREATININE 0.81 07/16/2021 0805   CALCIUM 9.8 07/16/2021 0805   PROT 7.0 07/16/2021 0805   ALBUMIN 4.6 07/16/2021 0805   AST 23 07/16/2021 0805   ALT 30 07/16/2021 0805   ALKPHOS 94 07/16/2021 0805   BILITOT 0.4 07/16/2021 0805   Lab Results  Component Value Date   CHOL 274 (H) 07/16/2021   HDL 39 (L) 07/16/2021   LDLCALC 189 (H) 07/16/2021   TRIG 237 (H) 07/16/2021   CHOLHDL 7.0 (H) 07/16/2021   Lab Results  Component Value Date   HGBA1C 6.0 (H) 07/16/2021   Lab Results  Component Value Date   VITAMINB12 543 03/21/2021   Lab Results  Component Value Date   TSH 2.140 03/21/2021       ASSESSMENT AND PLAN  Chronic nonintractable headache, unspecified headache type  Neck pain  Excessive daytime sleepiness    Bilateral splenius capitis, C2-C3, C6-C7 and C7-T1 paraspinal muscle trigger point injection with 6 cc Marcaine containing 80 mg Depo-Medrol using sterile technique.  She tolerated the procedure well and pain was better a c ouple afterwards. Increase imipramine 50 mg nightly.   Maxalt prn migraine in future.     Stay active and exercise.    Return in 4 months or sooner if there are new or worsening neurologic symptoms.   Takoya Jonas A. Epimenio Foot, MD, [place] [facility] 11/19/2021, 2:18 PM Certified in Neurology, Clinical Neurophysiology, Sleep Medicine and Neuroimaging  [facility] [address], [address] [place], [place] [address] [phone]

## 2021-12-26 ENCOUNTER — Other Ambulatory Visit: Payer: Self-pay | Admitting: Nurse Practitioner

## 2021-12-26 DIAGNOSIS — E559 Vitamin D deficiency, unspecified: Secondary | ICD-10-CM

## 2022-01-04 ENCOUNTER — Ambulatory Visit (INDEPENDENT_AMBULATORY_CARE_PROVIDER_SITE_OTHER): Payer: No Typology Code available for payment source | Admitting: Nurse Practitioner

## 2022-01-04 ENCOUNTER — Encounter: Payer: Self-pay | Admitting: Nurse Practitioner

## 2022-01-04 VITALS — BP 122/84 | HR 86 | Temp 96.1°F | Ht 63.0 in | Wt 215.0 lb

## 2022-01-04 DIAGNOSIS — Z1231 Encounter for screening mammogram for malignant neoplasm of breast: Secondary | ICD-10-CM

## 2022-01-04 DIAGNOSIS — E559 Vitamin D deficiency, unspecified: Secondary | ICD-10-CM | POA: Diagnosis not present

## 2022-01-04 DIAGNOSIS — F411 Generalized anxiety disorder: Secondary | ICD-10-CM

## 2022-01-04 DIAGNOSIS — Z6838 Body mass index (BMI) 38.0-38.9, adult: Secondary | ICD-10-CM

## 2022-01-04 DIAGNOSIS — L989 Disorder of the skin and subcutaneous tissue, unspecified: Secondary | ICD-10-CM

## 2022-01-04 DIAGNOSIS — E782 Mixed hyperlipidemia: Secondary | ICD-10-CM

## 2022-01-04 DIAGNOSIS — J4520 Mild intermittent asthma, uncomplicated: Secondary | ICD-10-CM | POA: Diagnosis not present

## 2022-01-04 DIAGNOSIS — F17211 Nicotine dependence, cigarettes, in remission: Secondary | ICD-10-CM

## 2022-01-04 DIAGNOSIS — B372 Candidiasis of skin and nail: Secondary | ICD-10-CM

## 2022-01-04 DIAGNOSIS — Z789 Other specified health status: Secondary | ICD-10-CM

## 2022-01-04 MED ORDER — FLUCONAZOLE 150 MG PO TABS: 150.0000 mg | ORAL_TABLET | Freq: Once | ORAL | 0 refills | Status: AC

## 2022-01-04 MED ORDER — VITAMIN D (ERGOCALCIFEROL) 1.25 MG (50000 UNIT) PO CAPS: 50000.0000 [IU] | ORAL_CAPSULE | ORAL | 0 refills | Status: DC

## 2022-01-04 MED ORDER — NYSTATIN 100000 UNIT/GM EX CREA: 1.0000 | TOPICAL_CREAM | Freq: Two times a day (BID) | CUTANEOUS | 0 refills | Status: DC

## 2022-01-04 MED ORDER — ALBUTEROL SULFATE HFA 108 (90 BASE) MCG/ACT IN AERS: 2.0000 | INHALATION_SPRAY | Freq: Four times a day (QID) | RESPIRATORY_TRACT | 0 refills | Status: DC | PRN

## 2022-01-04 MED ORDER — NEXLIZET 180-10 MG PO TABS: 1.0000 | ORAL_TABLET | Freq: Every day | ORAL | 0 refills | Status: DC

## 2022-01-04 MED ORDER — MUPIROCIN 2 % EX OINT: 1.0000 | TOPICAL_OINTMENT | Freq: Two times a day (BID) | CUTANEOUS | 0 refills | Status: DC

## 2022-01-04 MED ORDER — NEXLIZET 180-10 MG PO TABS: 1.0000 | ORAL_TABLET | Freq: Every day | ORAL | 2 refills | Status: DC

## 2022-01-04 NOTE — Patient Instructions (Addendum)
We will call you with lab results Begin Nexlizet daily for cholesterol Take Diflucan 150 mg once by mouth Apply Nystatin cream to intertrigo rash twice daily Bactobran cream to skin lesions on buttocks twice daily Contact [facility] Follow-up in 28-months, fasting    Intertrigo Intertrigo is skin irritation (inflammation) that happens in warm, moist areas of the body. The irritation can cause a rash and make skin raw and itchy. The rash is usually pink or red. It happens mostly between folds of skin or where skin rubs together, such as: Between the toes. In the armpits. In the groin area. Under the belly. Under the breasts. Around the butt area. This condition is not passed from person to person (is not contagious). What are the causes? Heat, moisture, rubbing, and not enough air movement. The condition can be made worse by: Sweat. Bacteria. A fungus, such as yeast. What increases the risk? Moisture in your skin folds. You are more likely to develop this condition if you: Have diabetes. Are overweight. Are not able to move around. Live in a warm and moist climate. Wear splints, braces, or other medical devices. Are not able to control your pee (urine) or poop (stool). What are the signs or symptoms? A pink or red skin rash in the skin fold or near the skin fold. Raw or scaly skin. Itching. A burning feeling. Bleeding. Leaking fluid. A bad smell. How is this treated? Cleaning and drying your skin. Taking an antibiotic medicine or using an antibiotic skin cream for a bacterial infection. Using an antifungal cream on your skin or taking pills for an infection that was caused by a fungus, such as yeast. Using a steroid ointment to stop the itching and irritation. Separating the skin fold with a clean cotton cloth to absorb moisture and allow air to flow into the area. Follow these instructions at home: Keep the affected area clean and dry. Do not scratch your  skin. Stay cool as much as you can. Use an air conditioner or a fan, if you have one. Apply over-the-counter and prescription medicines only as told by your doctor. If you were prescribed an antibiotic medicine, use it as told by your doctor. Do not stop using the antibiotic even if your condition starts to get better. Keep all follow-up visits as told by your doctor. This is important. How is this prevented?  Stay at a healthy weight. Take care of your feet. This is very important if you have diabetes. You should: Wear shoes that fit well. Keep your feet dry. Wear clean cotton or wool socks. Protect the skin in your groin and butt area as told by your doctor. To do this: Follow a regular cleaning routine. Use creams, powders, or ointments that protect your skin. Change protection pads often. Do not wear tight clothes. Wear clothes that: Are loose. Take moisture away from your body. Are made of cotton. Wear a bra that gives good support, if needed. Shower and dry yourself well after being active. Use a hair dryer on a cool setting to dry between skin folds. Keep your blood sugar under control if you have diabetes. Contact a doctor if: Your symptoms do not get better with treatment. Your symptoms get worse or they spread. You notice more redness and warmth. You have a fever. Summary Intertrigo is skin irritation that occurs when folds of skin rub together. This condition is caused by heat, moisture, and rubbing. This condition may be treated by cleaning and drying your skin  and with medicines. Apply over-the-counter and prescription medicines only as told by your doctor. Keep all follow-up visits as told by your doctor. This is important. This information is not intended to replace advice given to you by your health care provider. Make sure you discuss any questions you have with your health care provider. Document Revised: 07/25/2021 Document Reviewed: 02/26/2021 Elsevier Patient  Education  2023 [employer] Preventing High Cholesterol Cholesterol is a white, waxy substance similar to fat that the human body needs to help build cells. The liver makes all the cholesterol that a person's body needs. Having high cholesterol (hypercholesterolemia) increases your risk for heart disease and stroke. Extra or excess cholesterol comes from the food that you eat. High cholesterol can often be prevented with diet and lifestyle changes. If you already have high cholesterol, you can control it with diet, lifestyle changes, and medicines. How can high cholesterol affect me? If you have high cholesterol, fatty deposits (plaques) may build up on the walls of your blood vessels. The blood vessels that carry blood away from your heart are called arteries. Plaques make the arteries narrower and stiffer. This in turn can: Restrict or block blood flow and cause blood clots to form. Increase your risk for heart attack and stroke. What can increase my risk for high cholesterol? This condition is more likely to develop in people who: Eat foods that are high in saturated fat or cholesterol. Saturated fat is mostly found in foods that come from animal sources. Are overweight. Are not getting enough exercise. Use products that contain nicotine or tobacco, such as cigarettes, e-cigarettes, and chewing tobacco. Have a family history of high cholesterol (familial hypercholesterolemia). What actions can I take to prevent this? Nutrition  Eat less saturated fat. Avoid trans fats (partially hydrogenated oils). These are often found in margarine and in some baked goods, fried foods, and snacks bought in packages. Avoid precooked or cured meat, such as bacon, sausages, or meat loaves. Avoid foods and drinks that have added sugars. Eat more fruits, vegetables, and whole grains. Choose healthy sources of protein, such as fish, poultry, lean cuts of red meat, beans, peas, lentils, and nuts. Choose  healthy sources of fat, such as: Nuts. Vegetable oils, especially olive oil. Fish that have healthy fats, such as omega-3 fatty acids. These fish include mackerel or salmon. Lifestyle Lose weight if you are overweight. Maintaining a healthy body mass index (BMI) can help prevent or control high cholesterol. It can also lower your risk for diabetes and high blood pressure. Ask your health care provider to help you with a diet and exercise plan to lose weight safely. Do not use any products that contain nicotine or tobacco. These products include cigarettes, chewing tobacco, and vaping devices, such as e-cigarettes. If you need help quitting, ask your health care provider. Alcohol use Do not drink alcohol if: Your health care provider tells you not to drink. You are pregnant, may be pregnant, or are planning to become pregnant. If you drink alcohol: Limit how much you have to: 0-1 drink a day for women. 0-2 drinks a day for men. Know how much alcohol is in your drink. In the [place] one drink equals one 12 oz bottle of beer (355 mL), one 5 oz glass of wine (148 mL), or one 1 oz glass of hard liquor (44 mL). Activity  Get enough exercise. Do exercises as told by your health care provider. Each week, do at least 150 minutes of exercise that takes  a medium level of effort (moderate-intensity exercise). This kind of exercise: Makes your heart beat faster while allowing you to still be able to talk. Can be done in short sessions several times a day or longer sessions a few times a week. For example, on 5 days each week, you could walk fast or ride your bike 3 times a day for 10 minutes each time. Medicines Your health care provider may recommend medicines to help lower cholesterol. This may be a medicine to lower the amount of cholesterol that your liver makes. You may need medicine if: Diet and lifestyle changes have not lowered your cholesterol enough. You have high cholesterol and other risk  factors for heart disease or stroke. Take over-the-counter and prescription medicines only as told by your health care provider. General information Manage your risk factors for high cholesterol. Talk with your health care provider about all your risk factors and how to lower your risk. Manage other conditions that you have, such as diabetes or high blood pressure (hypertension). Have blood tests to check your cholesterol levels at regular points in time as told by your health care provider. Keep all follow-up visits. This is important. Where to find more information American Heart Association: www.heart.org National Heart, Lung, and Blood Institute: [employer].is Summary High cholesterol increases your risk for heart disease and stroke. By keeping your cholesterol level low, you can reduce your risk for these conditions. High cholesterol can often be prevented with diet and lifestyle changes. Work with your health care provider to manage your risk factors, and have your blood tested regularly. This information is not intended to replace advice given to you by your health care provider. Make sure you discuss any questions you have with your health care provider. Document Revised: 07/17/2020 Document Reviewed: 07/17/2020 Elsevier Patient Education  2023 [employer]     Bempedoic Acid; Ezetimibe Tablets What is this medication? BEMPEDOIC ACID; EZETIMIBE (BEM pe DOE ik AS id; ez ET i mibe) treats high cholesterol. It works by reducing the amount of cholesterol made by your body. It also reduces the amount of cholesterol absorbed from the food you eat. This decreases the amount of bad cholesterol (such as LDL) in your blood. Changes to diet and exercise are often combined with this medication. This medicine may be used for other purposes; ask your health care provider or pharmacist if you have questions. COMMON BRAND NAME(S): NEXLIZET What should I tell my care team before I take this  medication? They need to know if you have any of these conditions: Gout Kidney problems Liver disease Tendon problems An unusual or allergic reaction to bempedoic acid, ezetimibe, other medications, foods, dyes, or preservatives Pregnant or trying to become pregnant Breast-feeding How should I use this medication? Take this medication by mouth. Take it as directed on the prescription label at the same time every day. Do not cut, crush, or chew this medication. Swallow the tablets whole. You can take it with or without food. If it upsets your stomach, take it with food. Keep taking it unless your care team tells you to stop. Take this medication at least 2 hours BEFORE or at least 4 hours AFTER administration of a bile acid sequestrant. Talk to your care team about the use of this medication in children. Special care may be needed. Take this medication by mouth. Take it as directed on the prescription label at the same time every day. Do not cut, crush or chew this medication. Swallow the tablets  whole. You can take it with or without food. If it upsets your stomach, take it with food. Keep taking it unless your care team tells you to stop.Take this medication at least 2 hours BEFORE or at least 4 hours AFTER administration of a bile acid sequestrant.Talk to your care team about the use of this medication in children. Special care may be needed. Overdosage: If you think you have taken too much of this medicine contact a poison control center or emergency room at once. NOTE: This medicine is only for you. Do not share this medicine with others. What if I miss a dose? If you miss a dose, take it as soon as you can. If it is almost time for your next dose, take only that dose. Do not take double or extra doses. What may interact with this medication? Do not take this medication with any of the following: Fenofibrate Gemfibrozil This medication may also interact with the  following: Antacids Cyclosporine Other medications to lower cholesterol or triglycerides Pravastatin Simvastatin This list may not describe all possible interactions. Give your health care provider a list of all the medicines, herbs, non-prescription drugs, or dietary supplements you use. Also tell them if you smoke, drink alcohol, or use illegal drugs. Some items may interact with your medicine. What should I watch for while using this medication? Visit your care team for regular checks on your progress. Tell your care team if your symptoms do not start to get better or if they get worse. Your care team may tell you to stop taking this medication if you develop muscle problems. If your muscle problems do not go away after stopping this medication, contact your care team. Taking this medication is only part of a total heart healthy program. Ask your care team if there are other changes you can make to improve your overall health. What side effects may I notice from receiving this medication? Side effects that you should report to your care team as soon as possible: Allergic reactions--skin rash, itching, hives, swelling of the face, lips, tongue, or throat High uric acid level--severe pain, redness, warmth, or swelling in joints, pain or trouble passing urine, pain in the lower back or sides Joint, muscle, or tendon pain, swelling, or stiffness Side effects that usually do not require medical attention (report to your care team if they continue or are bothersome): Diarrhea Fatigue Muscle spasms Stomach pain This list may not describe all possible side effects. Call your doctor for medical advice about side effects. You may report side effects to FDA at 1-800-FDA-1088. Where should I keep my medication? Keep out of the reach of children and pets. Store between 15 and 30 degrees C (59 and 86 degrees F). Keep this medication in the original container. Protect from moisture. Keep the container  tightly closed. Do not throw out the packet in the container. It keeps the medication dry. Avoid exposure to extreme heat. Get rid of any unused medication after the expiration date. To get rid of medications that are no longer needed or have expired: Take the medication to a medication take-back program. Check with your pharmacy or law enforcement to find a location. If you cannot return the medication, check the label or package insert to see if the medication should be thrown out in the trash or flushed down the toilet. If you are not sure, ask your care team. If it is safe to put it in the trash, empty the medication out of the  container. Mix the medication with cat litter, dirt, coffee grounds, or other unwanted substance. Seal the mixture in a bag or container. Put it in the trash. NOTE: This sheet is a summary. It may not cover all possible information. If you have questions about this medicine, talk to your doctor, pharmacist, or health care provider.  2023 Elsevier/Gold Standard (2021-05-15 00:00:00)

## 2022-01-04 NOTE — Progress Notes (Signed)
Subjective:  Patient ID: Kimberly Cabrera, female    DOB: June 05, 1975  Age: 46 y.o. MRN: [id number]  Chief Complaint  Patient presents with   Anxiety   Vitamin D def   HPI: Pt presents for follow-up of chronic hyperlipidemia, anxiety, and Vit D deficiency. States she has stopped smoking since her last visit. Pt reports rash under bilateral axilla, lower abd fold and bilateral groin. Onset was several weeks ago. Treatment has included OTC Terbinafine with minimal improvement. States she has multiple skin lesions to buttocks. She denies changing soaps, lotions, detergents, or exposure to outdoor plants, ticks. She has not tried any treatments to buttock skin lesions.   Lipid/Cholesterol, Follow-up  Last lipid panel Other pertinent labs  Lab Results  Component Value Date   CHOL 274 (H) 07/16/2021   HDL 39 (L) 07/16/2021   LDLCALC 189 (H) 07/16/2021   TRIG 237 (H) 07/16/2021   CHOLHDL 7.0 (H) 07/16/2021   Lab Results  Component Value Date   ALT 30 07/16/2021   AST 23 07/16/2021   PLT 276 03/21/2021   TSH 2.140 03/21/2021     She was last seen for this 10 months ago.  Management includes diet modification. Pt states she is intolerant to statin therapy due to myalgias; was previously prescribed Crestor.  She is not currently exercising regularly or eating heart healthy diet.  The 10-year ASCVD risk score (Arnett DK, et al., 2019) is: 7.3%  Anxiety, Follow-up  She was last seen for anxiety 10 months ago. Current treatment includes Prozac and Ativan.  She reports excellent compliance with treatment. She reports excellent tolerance of treatment. She is not having side effects.    GAD-7 Results    07/16/2021    7:26 AM 02/28/2021   10:12 AM  GAD-7 Generalized Anxiety Disorder Screening Tool  1. Feeling Nervous, Anxious, or on Edge 3 2  2. Not Being Able to Stop or Control Worrying 3 1  3. Worrying Too Much About Different Things 3 1  4. Trouble Relaxing 3 1  5. Being So Restless  it's Hard To Sit Still 3 1  6. Becoming Easily Annoyed or Irritable 2 3  7. Feeling Afraid As If Something Awful Might Happen 3 1  Total GAD-7 Score 20 10    PHQ-9 Scores    03/21/2021    8:59 AM  PHQ9 SCORE ONLY  PHQ-9 Total Score 0    Vitamin D deficiency, follow-up  Lab Results  Component Value Date   VD25OH 26.7 (L) 07/16/2021   VD25OH 13.2 (L) 03/21/2021   CALCIUM 9.8 07/16/2021   CALCIUM 9.8 03/21/2021   Wt Readings from Last 3 Encounters:  01/04/22 215 lb (97.5 kg)  11/19/21 212 lb (96.2 kg)  07/17/21 219 lb (99.3 kg)    She was last seen for vitamin D deficiency. Management since that visit includes Vitamin D 50,000 units and Vit D rich diet She reports excellent compliance with treatment. She is not having side effects.       Current Outpatient Medications on File Prior to Visit  Medication Sig Dispense Refill   albuterol (VENTOLIN HFA) 108 (90 Base) MCG/ACT inhaler Inhale 2 puffs into the lungs every 6 (six) hours as needed for wheezing.     Aspirin-Acetaminophen (GOODYS BODY PAIN PO) Take 1 Package by mouth in the morning and at bedtime.     FLUoxetine (PROZAC) 10 MG capsule Take 10 mg by mouth See admin instructions. Take with 40 ng cap for a total of  50 mg daily     FLUoxetine (PROZAC) 40 MG capsule Take 40 mg by mouth See admin instructions. Take with 10 mg for a total of 50 mg daily     imipramine (TOFRANIL) 25 MG tablet Take 2 tablets (50 mg total) by mouth at bedtime. 60 tablet 11   LORazepam (ATIVAN) 0.5 MG tablet Take 0.5 mg by mouth 3 (three) times daily as needed for anxiety.     ondansetron (ZOFRAN) 4 MG tablet Take 4 mg by mouth every 6 (six) hours as needed for nausea or vomiting.     promethazine (PHENERGAN) 25 MG tablet Take 25 mg by mouth every 8 (eight) hours as needed for nausea or vomiting.     rizatriptan (MAXALT-MLT) 10 MG disintegrating tablet Take 1 tablet (10 mg total) by mouth as needed for migraine. May repeat in 2 hours if needed 9  tablet 11   Vitamin D, Ergocalciferol, (DRISDOL) 1.25 MG (50000 UNIT) CAPS capsule TAKE 1 CAPSULE BY MOUTH EVERY 7 DAYS 5 capsule 0   No current facility-administered medications on file prior to visit.   Past Medical History:  Diagnosis Date   Anxiety    Asthma    seasonal asthma   Depression    no current problem   GERD (gastroesophageal reflux disease)    occasional - otc med prn   Headache    History of kidney stones    surgery to remove   Hyperlipidemia    no meds, diet controlled   Past Surgical History:  Procedure Laterality Date   ABDOMINAL HYSTERECTOMY     46 yo. Total.   APPENDECTOMY     ECTOPIC PREGNANCY SURGERY N/A    laparoscopic   RADIOLOGY WITH ANESTHESIA N/A 07/17/2021   Procedure: MRI WITH ANESTHESIA BRAIN WITH AND WITHOUT CONTRAST, MRI CERVICAL SPINE WITH AND WITHOUT CONTRAST;  Surgeon: Radiologist, Medication, MD;  Location: [facility] OR;  Service: Radiology;  Laterality: N/A;   removal kidney stone     TONSILLECTOMY     WISDOM TOOTH EXTRACTION      Family History  Problem Relation Age of Onset   Hyperlipidemia Mother    Diabetes Mother    Hyperlipidemia Father    Diabetes Father    Heart disease Father    Dementia Father    Melanoma Father    Melanoma Sister    Diabetes Sister    Hypertension Maternal Grandmother    Mental illness Maternal Grandmother    Asthma Paternal Grandfather    Heart attack Paternal Grandfather    Arthritis Paternal Grandfather    Social History   Socioeconomic History   Marital status: Married    Spouse name: Ericah Scotto   Number of children: Not on file   Years of education: Not on file   Highest education level: Not on file  Occupational History   Occupation: Charity fundraiser    Comment: [employer] ([place]  Tobacco Use   Smoking status: Former    Packs/day: 0.50    Years: 5.00    Total pack years: 2.50    Types: Cigarettes    Quit date: 05/07/2021    Years since quitting: 0.6   Smokeless tobacco: Never   Tobacco  comments:    Smokes 2-3 cigarettes a day  Vaping Use   Vaping Use: Never used  Substance and Sexual Activity   Alcohol use: Never   Drug use: Never   Sexual activity: Not Currently    Partners: Male    Birth control/protection: Surgical  Comment: Hysterectomy  Other Topics Concern   Not on file  Social History Narrative   Not on file   Social Determinants of Health   Financial Resource Strain: Low Risk  (01/04/2022)   Overall Financial Resource Strain (CARDIA)    Difficulty of Paying Living Expenses: Not hard at all  Food Insecurity: No Food Insecurity (01/04/2022)   Hunger Vital Sign    Worried About Running Out of Food in the Last Year: Never true    Ran Out of Food in the Last Year: Never true  Transportation Needs: No Transportation Needs (01/04/2022)   PRAPARE - Administrator, Civil ServiceTransportation    Lack of Transportation (Medical): No    Lack of Transportation (Non-Medical): No  Physical Activity: Inactive (01/04/2022)   Exercise Vital Sign    Days of Exercise per Week: 0 days    Minutes of Exercise per Session: 0 min  Stress: No Stress Concern Present (01/04/2022)   [employer] - Occupational Stress Questionnaire    Feeling of Stress : Not at all  Social Connections: Moderately Isolated (01/04/2022)   Social Connection and Isolation Panel [NHANES]    Frequency of Communication with Friends and Family: More than three times a week    Frequency of Social Gatherings with Friends and Family: More than three times a week    Attends Religious Services: Never    Database administratorActive Member of Clubs or Organizations: No    Attends BankerClub or Organization Meetings: Never    Marital Status: Married    Review of Systems  Constitutional:  Negative for chills, fatigue and fever.  HENT:  Negative for congestion, ear pain, rhinorrhea and sore throat.   Respiratory:  Negative for cough and shortness of breath.   Cardiovascular:  Negative for chest pain.  Gastrointestinal:  Negative for  abdominal pain, constipation, diarrhea, nausea and vomiting.  Genitourinary:  Negative for dysuria and urgency.  Musculoskeletal:  Negative for back pain and myalgias.  Skin:  Positive for rash (Breast, Inguinal area, axilla).  Neurological:  Negative for dizziness, weakness, light-headedness and headaches.  Psychiatric/Behavioral:  Negative for dysphoric mood. The patient is not nervous/anxious.      Objective:  BP 122/84   Pulse 86   Temp (!) 96.1 F (35.6 C)   Ht 5\' 3"  (1.6 m)   Wt 215 lb (97.5 kg)   SpO2 95%   BMI 38.09 kg/m      01/04/2022    8:23 AM 11/19/2021    1:35 PM 07/17/2021   10:20 AM  BP/Weight  Systolic BP 122 125 124  Diastolic BP 84 92 87  Wt. (Lbs) 215 212   BMI 38.09 kg/m2 37.55 kg/m2     Physical Exam Vitals reviewed.  Constitutional:      Appearance: She is obese.  HENT:     Right Ear: Tympanic membrane normal.     Left Ear: Tympanic membrane normal.     Nose: Nose normal.     Mouth/Throat:     Mouth: Mucous membranes are moist.  Eyes:     Pupils: Pupils are equal, round, and reactive to light.  Cardiovascular:     Rate and Rhythm: Normal rate and regular rhythm.     Pulses: Normal pulses.     Heart sounds: Normal heart sounds.  Pulmonary:     Effort: Pulmonary effort is normal.     Breath sounds: Normal breath sounds.  Abdominal:     General: Bowel sounds are normal.     Palpations:  Abdomen is soft.  Skin:    Capillary Refill: Capillary refill takes less than 2 seconds.     Findings: Lesion and rash (intertrigo rash to bilateral axilla, lower abd skin folds, bilateral groin) present.          Comments: Multiple acne like skin lesions noted to bilateral buttocks  Neurological:     General: No focal deficit present.     Mental Status: She is alert and oriented to person, place, and time.  Psychiatric:        Mood and Affect: Mood normal.        Behavior: Behavior normal.     Lab Results  Component Value Date   WBC 6.2 03/21/2021    HGB 14.7 03/21/2021   HCT 44.0 03/21/2021   PLT 276 03/21/2021   GLUCOSE 101 (H) 07/16/2021   CHOL 274 (H) 07/16/2021   TRIG 237 (H) 07/16/2021   HDL 39 (L) 07/16/2021   LDLCALC 189 (H) 07/16/2021   ALT 30 07/16/2021   AST 23 07/16/2021   NA 144 07/16/2021   K 4.7 07/16/2021   CL 104 07/16/2021   CREATININE 0.81 07/16/2021   BUN 6 07/16/2021   CO2 24 07/16/2021   TSH 2.140 03/21/2021   HGBA1C 6.0 (H) 07/16/2021      Assessment & Plan:   1. Mixed hyperlipidemia-not at goal - Bempedoic Acid-Ezetimibe (NEXLIZET) 180-10 MG TABS; Take 1 tablet by mouth daily.  Dispense: 21 tablet; Refill: 0 - Bempedoic Acid-Ezetimibe (NEXLIZET) 180-10 MG TABS; Take 1 tablet by mouth daily.  Dispense: 30 tablet; Refill: 2 - CBC with Differential/Platelet - Lipid panel - Comprehensive metabolic panel -heart healthy diet -increase physical activity  2. Vitamin D deficiency-not at goal - Vitamin D, Ergocalciferol, (DRISDOL) 1.25 MG (50000 UNIT) CAPS capsule; Take 1 capsule (50,000 Units total) by mouth every 7 (seven) days.  Dispense: 5 capsule; Refill: 0 - VITAMIN D 25 Hydroxy (Vit-D Deficiency, Fractures) -Vit D rich diet  3. GAD (generalized anxiety disorder)-well controlled - CBC with Differential/Platelet - Comprehensive metabolic panel - T4, free - TSH -continue Prozac and Ativan as prescribed -recommend counseling  4. Mild intermittent asthma, unspecified whether complicated - albuterol (VENTOLIN HFA) 108 (90 Base) MCG/ACT inhaler; Inhale 2 puffs into the lungs every 6 (six) hours as needed for wheezing.  Dispense: 1 each; Refill: 0  5. Candidiasis, intertrigo - fluconazole (DIFLUCAN) 150 MG tablet; Take 1 tablet (150 mg total) by mouth once for 1 dose.  Dispense: 1 tablet; Refill: 0 - nystatin cream (MYCOSTATIN); Apply 1 Application topically 2 (two) times daily.  Dispense: 30 g; Refill: 0  6. Skin lesions - mupirocin ointment (BACTROBAN) 2 %; Apply 1 Application topically 2  (two) times daily.  Dispense: 22 g; Refill: 0  7. Statin intolerance - Bempedoic Acid-Ezetimibe (NEXLIZET) 180-10 MG TABS; Take 1 tablet by mouth daily.  Dispense: 21 tablet; Refill: 0 - Bempedoic Acid-Ezetimibe (NEXLIZET) 180-10 MG TABS; Take 1 tablet by mouth daily.  Dispense: 30 tablet; Refill: 2  8. Class 2 severe obesity due to excess calories with serious comorbidity and body mass index (BMI) of 38.0 to 38.9 in adult (HCC) - CBC with Differential/Platelet - Lipid panel - Comprehensive metabolic panel - VITAMIN D 25 Hydroxy (Vit-D Deficiency, Fractures) - T4, free - TSH  9. Encounter for screening mammogram for malignant neoplasm of breast - MM Digital Screening; Future  10. Cigarette nicotine dependence in remission      We will call you with lab results and  mammogram appt  Begin Nexlizet daily for cholesterol Take Diflucan 150 mg once by mouth Apply Nystatin cream to intertrigo rash twice daily Bactobran cream to skin lesions on buttocks twice daily Contact [facility] Follow-up in 32-months, fasting  Follow-up: 87-months, fasting  An After Visit Summary was printed and given to the patient.  I, Janie Morning, NP, have reviewed all documentation for this visit. The documentation on 01/04/22 for the exam, diagnosis, procedures, and orders are all accurate and complete.    Signed, Janie Morning, NP [facility] [phone]

## 2022-01-05 LAB — CBC WITH DIFFERENTIAL/PLATELET
Basophils Absolute: 0 10*3/uL (ref 0.0–0.2)
Basos: 1 %
EOS (ABSOLUTE): 0.1 10*3/uL (ref 0.0–0.4)
Eos: 2 %
Hematocrit: 40.9 % (ref 34.0–46.6)
Hemoglobin: 13.9 g/dL (ref 11.1–15.9)
Immature Grans (Abs): 0 10*3/uL (ref 0.0–0.1)
Immature Granulocytes: 1 %
Lymphocytes Absolute: 2.3 10*3/uL (ref 0.7–3.1)
Lymphs: 44 %
MCH: 29.6 pg (ref 26.6–33.0)
MCHC: 34 g/dL (ref 31.5–35.7)
MCV: 87 fL (ref 79–97)
Monocytes Absolute: 0.4 10*3/uL (ref 0.1–0.9)
Monocytes: 7 %
Neutrophils Absolute: 2.4 10*3/uL (ref 1.4–7.0)
Neutrophils: 45 %
Platelets: 254 10*3/uL (ref 150–450)
RBC: 4.7 x10E6/uL (ref 3.77–5.28)
RDW: 12.2 % (ref 11.7–15.4)
WBC: 5.3 10*3/uL (ref 3.4–10.8)

## 2022-01-05 LAB — COMPREHENSIVE METABOLIC PANEL
ALT: 54 IU/L — ABNORMAL HIGH (ref 0–32)
AST: 40 IU/L (ref 0–40)
Albumin/Globulin Ratio: 2.4 — ABNORMAL HIGH (ref 1.2–2.2)
Albumin: 4.7 g/dL (ref 3.9–4.9)
Alkaline Phosphatase: 102 IU/L (ref 44–121)
BUN/Creatinine Ratio: 8 — ABNORMAL LOW (ref 9–23)
BUN: 6 mg/dL (ref 6–24)
Bilirubin Total: 0.3 mg/dL (ref 0.0–1.2)
CO2: 22 mmol/L (ref 20–29)
Calcium: 9 mg/dL (ref 8.7–10.2)
Chloride: 104 mmol/L (ref 96–106)
Creatinine, Ser: 0.76 mg/dL (ref 0.57–1.00)
Globulin, Total: 2 g/dL (ref 1.5–4.5)
Glucose: 117 mg/dL — ABNORMAL HIGH (ref 70–99)
Potassium: 4.4 mmol/L (ref 3.5–5.2)
Sodium: 143 mmol/L (ref 134–144)
Total Protein: 6.7 g/dL (ref 6.0–8.5)
eGFR: 99 mL/min/{1.73_m2} (ref >59)

## 2022-01-05 LAB — LIPID PANEL
Chol/HDL Ratio: 6.5 ratio — ABNORMAL HIGH (ref 0.0–4.4)
Cholesterol, Total: 253 mg/dL — ABNORMAL HIGH (ref 100–199)
HDL: 39 mg/dL — ABNORMAL LOW (ref >39)
LDL Chol Calc (NIH): 179 mg/dL — ABNORMAL HIGH (ref 0–99)
Triglycerides: 189 mg/dL — ABNORMAL HIGH (ref 0–149)
VLDL Cholesterol Cal: 35 mg/dL (ref 5–40)

## 2022-01-05 LAB — T4, FREE: Free T4: 0.99 ng/dL (ref 0.82–1.77)

## 2022-01-05 LAB — VITAMIN D 25 HYDROXY (VIT D DEFICIENCY, FRACTURES): Vit D, 25-Hydroxy: 25.4 ng/mL — ABNORMAL LOW (ref 30.0–100.0)

## 2022-01-05 LAB — CARDIOVASCULAR RISK ASSESSMENT

## 2022-01-05 LAB — TSH: TSH: 2.32 u[IU]/mL (ref 0.450–4.500)

## 2022-01-10 LAB — SPECIMEN STATUS REPORT

## 2022-01-10 LAB — HGB A1C W/O EAG: Hgb A1c MFr Bld: 6 % — ABNORMAL HIGH (ref 4.8–5.6)

## 2022-01-24 ENCOUNTER — Telehealth: Payer: Self-pay | Admitting: Nurse Practitioner

## 2022-01-24 NOTE — Telephone Encounter (Signed)
   Kimberly Cabrera has been scheduled for the following appointment:  WHAT: SCREENING MAMMOGRAM WHERE: [place] DATE: 02/18/22 TIME: 5:30 PM CHECK-IN  A message has been left for the patient.

## 2022-02-20 ENCOUNTER — Other Ambulatory Visit: Payer: Self-pay

## 2022-02-20 DIAGNOSIS — Z1231 Encounter for screening mammogram for malignant neoplasm of breast: Secondary | ICD-10-CM

## 2022-02-20 LAB — MM DIGITAL SCREENING BILATERAL

## 2022-03-01 ENCOUNTER — Other Ambulatory Visit: Payer: Self-pay | Admitting: Nurse Practitioner

## 2022-03-01 DIAGNOSIS — J4520 Mild intermittent asthma, uncomplicated: Secondary | ICD-10-CM

## 2022-03-01 MED ORDER — ALBUTEROL SULFATE HFA 108 (90 BASE) MCG/ACT IN AERS: 2.0000 | INHALATION_SPRAY | Freq: Four times a day (QID) | RESPIRATORY_TRACT | 0 refills | Status: DC | PRN

## 2022-03-21 ENCOUNTER — Other Ambulatory Visit: Payer: Self-pay | Admitting: Nurse Practitioner

## 2022-03-21 DIAGNOSIS — E559 Vitamin D deficiency, unspecified: Secondary | ICD-10-CM

## 2022-03-27 ENCOUNTER — Other Ambulatory Visit: Payer: Self-pay | Admitting: Family Medicine

## 2022-03-27 DIAGNOSIS — J4520 Mild intermittent asthma, uncomplicated: Secondary | ICD-10-CM

## 2022-04-24 ENCOUNTER — Other Ambulatory Visit: Payer: Self-pay | Admitting: Nurse Practitioner

## 2022-04-24 DIAGNOSIS — E559 Vitamin D deficiency, unspecified: Secondary | ICD-10-CM

## 2022-05-29 ENCOUNTER — Other Ambulatory Visit: Payer: Self-pay | Admitting: Nurse Practitioner

## 2022-05-29 DIAGNOSIS — E559 Vitamin D deficiency, unspecified: Secondary | ICD-10-CM

## 2022-05-29 MED ORDER — VITAMIN D (ERGOCALCIFEROL) 1.25 MG (50000 UNIT) PO CAPS: 50000.0000 [IU] | ORAL_CAPSULE | ORAL | 3 refills | Status: DC

## 2022-06-07 ENCOUNTER — Encounter: Payer: Self-pay | Admitting: Nurse Practitioner

## 2022-06-07 ENCOUNTER — Other Ambulatory Visit: Payer: Self-pay | Admitting: Nurse Practitioner

## 2022-06-07 ENCOUNTER — Ambulatory Visit (INDEPENDENT_AMBULATORY_CARE_PROVIDER_SITE_OTHER): Payer: No Typology Code available for payment source | Admitting: Nurse Practitioner

## 2022-06-07 VITALS — BP 124/84 | HR 94 | Temp 97.3°F | Ht 63.0 in | Wt 223.0 lb

## 2022-06-07 DIAGNOSIS — E559 Vitamin D deficiency, unspecified: Secondary | ICD-10-CM

## 2022-06-07 DIAGNOSIS — Z5320 Procedure and treatment not carried out because of patient's decision for unspecified reasons: Secondary | ICD-10-CM

## 2022-06-07 DIAGNOSIS — J4520 Mild intermittent asthma, uncomplicated: Secondary | ICD-10-CM

## 2022-06-07 DIAGNOSIS — F17211 Nicotine dependence, cigarettes, in remission: Secondary | ICD-10-CM

## 2022-06-07 DIAGNOSIS — Z2821 Immunization not carried out because of patient refusal: Secondary | ICD-10-CM

## 2022-06-07 DIAGNOSIS — E782 Mixed hyperlipidemia: Secondary | ICD-10-CM

## 2022-06-07 DIAGNOSIS — R3911 Hesitancy of micturition: Secondary | ICD-10-CM | POA: Diagnosis not present

## 2022-06-07 DIAGNOSIS — F411 Generalized anxiety disorder: Secondary | ICD-10-CM

## 2022-06-07 DIAGNOSIS — B372 Candidiasis of skin and nail: Secondary | ICD-10-CM

## 2022-06-07 LAB — POCT URINALYSIS DIP (CLINITEK)
Bilirubin, UA: NEGATIVE
Blood, UA: NEGATIVE
Glucose, UA: NEGATIVE mg/dL
Ketones, POC UA: NEGATIVE mg/dL
Leukocytes, UA: NEGATIVE
Nitrite, UA: NEGATIVE
POC PROTEIN,UA: NEGATIVE
Spec Grav, UA: 1.01 (ref 1.010–1.025)
Urobilinogen, UA: 0.2 E.U./dL
pH, UA: 6 (ref 5.0–8.0)

## 2022-06-07 MED ORDER — PROMETHAZINE HCL 25 MG PO TABS
25.0000 mg | ORAL_TABLET | Freq: Three times a day (TID) | ORAL | 2 refills | Status: DC | PRN
Start: 2022-06-07 — End: 2022-08-13

## 2022-06-07 MED ORDER — FLUCONAZOLE 150 MG PO TABS: 150.0000 mg | ORAL_TABLET | ORAL | 0 refills | Status: DC

## 2022-06-07 MED ORDER — NYSTATIN 100000 UNIT/GM EX CREA: 1.0000 | TOPICAL_CREAM | Freq: Two times a day (BID) | CUTANEOUS | 1 refills | Status: DC

## 2022-06-07 MED ORDER — ONDANSETRON HCL 4 MG PO TABS: 4.0000 mg | ORAL_TABLET | Freq: Four times a day (QID) | ORAL | 2 refills | Status: DC | PRN

## 2022-06-07 MED ORDER — ALBUTEROL SULFATE HFA 108 (90 BASE) MCG/ACT IN AERS: INHALATION_SPRAY | RESPIRATORY_TRACT | 1 refills | Status: DC

## 2022-06-07 NOTE — Patient Instructions (Addendum)
We will call you with lab results Continue medications Follow-up in 65-months, fasting  Prediabetes Prediabetes is when your blood sugar (blood glucose) level is higher than normal but not high enough for you to be diagnosed with type 2 diabetes. Having prediabetes puts you at risk for developing type 2 diabetes (type 2 diabetes mellitus). With certain lifestyle changes, you may be able to prevent or delay the onset of type 2 diabetes. This is important because type 2 diabetes can lead to serious complications, such as: Heart disease. Stroke. Blindness. Kidney disease. Depression. Poor circulation in the feet and legs. In severe cases, this could lead to surgical removal of a leg (amputation). What are the causes? The exact cause of prediabetes is not known. It may result from insulin resistance. Insulin resistance develops when cells in the body do not respond properly to insulin that the body makes. This can cause excess glucose to build up in the blood. High blood glucose (hyperglycemia) can develop. What increases the risk? The following factors may make you more likely to develop this condition: You have a family member with type 2 diabetes. You are older than 45 years. You had a temporary form of diabetes during a pregnancy (gestational diabetes). You had polycystic ovary syndrome (PCOS). You are overweight or obese. You are inactive (sedentary). You have a history of heart disease, including problems with cholesterol levels, high levels of blood fats, or high blood pressure. What are the signs or symptoms? You may have no symptoms. If you do have symptoms, they may include: Increased hunger. Increased thirst. Increased urination. Vision changes, such as blurry vision. Tiredness (fatigue). How is this diagnosed? This condition can be diagnosed with blood tests. Your blood glucose may be checked with one or more of the following tests: A fasting blood glucose (FBG) test. You will  not be allowed to eat (you will fast) for at least 8 hours before a blood sample is taken. An A1C blood test (hemoglobin A1C). This test provides information about blood glucose levels over the previous 2?3 months. An oral glucose tolerance test (OGTT). This test measures your blood glucose at two points in time: After fasting. This is your baseline level. Two hours after you drink a beverage that contains glucose. You may be diagnosed with prediabetes if: Your FBG is 100?125 mg/dL (5.6-6.9 mmol/L). Your A1C level is 5.7?6.4% (39-46 mmol/mol). Your OGTT result is 140?199 mg/dL (7.8-11 mmol/L). These blood tests may be repeated to confirm your diagnosis. How is this treated? Treatment may include dietary and lifestyle changes to help lower your blood glucose and prevent type 2 diabetes from developing. In some cases, medicine may be prescribed to help lower the risk of type 2 diabetes. Follow these instructions at home: Nutrition  Follow a healthy meal plan. This includes eating lean proteins, whole grains, legumes, fresh fruits and vegetables, low-fat dairy products, and healthy fats. Follow instructions from your health care provider about eating or drinking restrictions. Meet with a dietitian to create a healthy eating plan that is right for you. Lifestyle Do moderate-intensity exercise for at least 30 minutes a day on 5 or more days each week, or as told by your health care provider. A mix of activities may be best, such as: Brisk walking, swimming, biking, and weight lifting. Lose weight as told by your health care provider. Losing 5-7% of your body weight can reverse insulin resistance. Do not drink alcohol if: Your health care provider tells you not to drink. You are  pregnant, may be pregnant, or are planning to become pregnant. If you drink alcohol: Limit how much you use to: 0-1 drink a day for women. 0-2 drinks a day for men. Be aware of how much alcohol is in your drink. In the  [place] one drink equals one 12 oz bottle of beer (355 mL), one 5 oz glass of wine (148 mL), or one 1 oz glass of hard liquor (44 mL). General instructions Take over-the-counter and prescription medicines only as told by your health care provider. You may be prescribed medicines that help lower the risk of type 2 diabetes. Do not use any products that contain nicotine or tobacco, such as cigarettes, e-cigarettes, and chewing tobacco. If you need help quitting, ask your health care provider. Keep all follow-up visits. This is important. Where to find more information American Diabetes Association: www.diabetes.org Academy of Nutrition and Dietetics: www.eatright.org American Heart Association: www.heart.org Contact a health care provider if: You have any of these symptoms: Increased hunger. Increased urination. Increased thirst. Fatigue. Vision changes, such as blurry vision. Get help right away if you: Have shortness of breath. Feel confused. Vomit or feel like you may vomit. Summary Prediabetes is when your blood sugar (blood glucose)level is higher than normal but not high enough for you to be diagnosed with type 2 diabetes. Having prediabetes puts you at risk for developing type 2 diabetes (type 2 diabetes mellitus). Make lifestyle changes such as eating a healthy diet and exercising regularly to help prevent diabetes. Lose weight as told by your health care provider. This information is not intended to replace advice given to you by your health care provider. Make sure you discuss any questions you have with your health care provider. Document Revised: 08/12/2019 Document Reviewed: 08/12/2019 Elsevier Patient Education  2023 Elsevier Inc.    Prediabetes Eating Plan Prediabetes is a condition that causes blood sugar (glucose) levels to be higher than normal. This increases the risk for developing type 2 diabetes (type 2 diabetes mellitus). Working with a health care provider or  nutrition specialist (dietitian) to make diet and lifestyle changes can help prevent the onset of diabetes. These changes may help you: Control your blood glucose levels. Improve your cholesterol levels. Manage your blood pressure. What are tips for following this plan? Reading food labels Read food labels to check the amount of fat, salt (sodium), and sugar in prepackaged foods. Avoid foods that have: Saturated fats. Trans fats. Added sugars. Avoid foods that have more than 300 milligrams (mg) of sodium per serving. Limit your sodium intake to less than 2,300 mg each day. Shopping Avoid buying pre-made and processed foods. Avoid buying drinks with added sugar. Cooking Cook with olive oil. Do not use butter, lard, or ghee. Bake, broil, grill, steam, or boil foods. Avoid frying. Meal planning  Work with your dietitian to create an eating plan that is right for you. This may include tracking how many calories you take in each day. Use a food diary, notebook, or mobile application to track what you eat at each meal. Consider following a Mediterranean diet. This includes: Eating several servings of fresh fruits and vegetables each day. Eating fish at least twice a week. Eating one serving each day of whole grains, beans, nuts, and seeds. Using olive oil instead of other fats. Limiting alcohol. Limiting red meat. Using nonfat or low-fat dairy products. Consider following a plant-based diet. This includes dietary choices that focus on eating mostly vegetables and fruit, grains, beans, nuts, and seeds.  If you have high blood pressure, you may need to limit your sodium intake or follow a diet such as the DASH (Dietary Approaches to Stop Hypertension) eating plan. The DASH diet aims to lower high blood pressure. Lifestyle Set weight loss goals with help from your health care team. It is recommended that most people with prediabetes lose 7% of their body weight. Exercise for at least 30 minutes  5 or more days a week. Attend a support group or seek support from a mental health counselor. Take over-the-counter and prescription medicines only as told by your health care provider. What foods are recommended? Fruits Berries. Bananas. Apples. Oranges. Grapes. Papaya. Mango. Pomegranate. Kiwi. Grapefruit. Cherries. Vegetables Lettuce. Spinach. Peas. Beets. Cauliflower. Cabbage. Broccoli. Carrots. Tomatoes. Squash. Eggplant. Herbs. Peppers. Onions. Cucumbers. Brussels sprouts. Grains Whole grains, such as whole-wheat or whole-grain breads, crackers, cereals, and pasta. Unsweetened oatmeal. Bulgur. Barley. Quinoa. Brown rice. Corn or whole-wheat flour tortillas or taco shells. Meats and other proteins Seafood. Poultry without skin. Lean cuts of pork and beef. Tofu. Eggs. Nuts. Beans. Dairy Low-fat or fat-free dairy products, such as yogurt, cottage cheese, and cheese. Beverages Water. Tea. Coffee. Sugar-free or diet soda. Seltzer water. Low-fat or nonfat milk. Milk alternatives, such as soy or almond milk. Fats and oils Olive oil. Canola oil. Sunflower oil. Grapeseed oil. Avocado. Walnuts. Sweets and desserts Sugar-free or low-fat pudding. Sugar-free or low-fat ice cream and other frozen treats. Seasonings and condiments Herbs. Sodium-free spices. Mustard. Relish. Low-salt, low-sugar ketchup. Low-salt, low-sugar barbecue sauce. Low-fat or fat-free mayonnaise. The items listed above may not be a complete list of recommended foods and beverages. Contact a dietitian for more information. What foods are not recommended? Fruits Fruits canned with syrup. Vegetables Canned vegetables. Frozen vegetables with butter or cream sauce. Grains Refined white flour and flour products, such as bread, pasta, snack foods, and cereals. Meats and other proteins Fatty cuts of meat. Poultry with skin. Breaded or fried meat. Processed meats. Dairy Full-fat yogurt, cheese, or milk. Beverages Sweetened  drinks, such as iced tea and soda. Fats and oils Butter. Lard. Ghee. Sweets and desserts Baked goods, such as cake, cupcakes, pastries, cookies, and cheesecake. Seasonings and condiments Spice mixes with added salt. Ketchup. Barbecue sauce. Mayonnaise. The items listed above may not be a complete list of foods and beverages that are not recommended. Contact a dietitian for more information. Where to find more information American Diabetes Association: www.diabetes.org Summary You may need to make diet and lifestyle changes to help prevent the onset of diabetes. These changes can help you control blood sugar, improve cholesterol levels, and manage blood pressure. Set weight loss goals with help from your health care team. It is recommended that most people with prediabetes lose 7% of their body weight. Consider following a Mediterranean diet. This includes eating plenty of fresh fruits and vegetables, whole grains, beans, nuts, seeds, fish, and low-fat dairy, and using olive oil instead of other fats. This information is not intended to replace advice given to you by your health care provider. Make sure you discuss any questions you have with your health care provider. Document Revised: 08/12/2019 Document Reviewed: 08/12/2019 Elsevier Patient Education  [place].

## 2022-06-07 NOTE — Progress Notes (Signed)
Subjective:  Patient ID: Kimberly Cabrera, female    DOB: 04-07-1976  Age: 47 y.o. MRN: [id number]  Chief Complaint  Patient presents with  . Hyperlipidemia  . Anxiety  . Vitamin D def    HPI   Lipid/Cholesterol, Follow-up  Last lipid panel Other pertinent labs  Lab Results  Component Value Date   CHOL 253 (H) 01/04/2022   HDL 39 (L) 01/04/2022   LDLCALC 179 (H) 01/04/2022   TRIG 189 (H) 01/04/2022   CHOLHDL 6.5 (H) 01/04/2022   Lab Results  Component Value Date   ALT 54 (H) 01/04/2022   AST 40 01/04/2022   PLT 254 01/04/2022   TSH 2.320 01/04/2022     She was last seen for this 4 months ago.  Management includes diet modification.  She reports excellent compliance with treatment. She is not having side effects.   Vitamin D deficiency, follow-up  Lab Results  Component Value Date   VD25OH 25.4 (L) 01/04/2022   VD25OH 26.7 (L) 07/16/2021   VD25OH 13.2 (L) 03/21/2021   CALCIUM 9.0 01/04/2022   CALCIUM 9.8 07/16/2021   Wt Readings from Last 3 Encounters:  06/07/22 223 lb (101.2 kg)  01/04/22 215 lb (97.5 kg)  11/19/21 212 lb (96.2 kg)    She was last seen for vitamin D deficiency 4 months ago.  Management since that visit includes vitamin D . She reports excellent compliance with treatment. She is not having side effects.   Anxiety, Follow-up  Current treatment includes prozac and ativan.   She reports excellent compliance with treatment. She reports excellent tolerance of treatment. She is not having side effects.     GAD-7 Results    06/07/2022   10:03 AM 01/04/2022    8:40 AM 07/16/2021    7:26 AM  GAD-7 Generalized Anxiety Disorder Screening Tool  1. Feeling Nervous, Anxious, or on Edge 1 1 3   2. Not Being Able to Stop or Control Worrying 0 1 3  3. Worrying Too Much About Different Things 1 1 3   4. Trouble Relaxing 0 1 3  5. Being So Restless it's Hard To Sit Still 0 1 3  6. Becoming Easily Annoyed or Irritable 2 2 2   7. Feeling Afraid As If  Something Awful Might Happen 0 1 3  Total GAD-7 Score 4 8 20   Difficulty At Work, Home, or Getting  Along With Others? Not difficult at all Not difficult at all     PHQ-9 Scores    06/07/2022   10:02 AM 03/21/2021    8:59 AM  PHQ9 SCORE ONLY  PHQ-9 Total Score 0 0      ---------------------------------------------------------------------------------------------------   Current Outpatient Medications on File Prior to Visit  Medication Sig Dispense Refill  . Aspirin-Acetaminophen (GOODYS BODY PAIN PO) Take 1 Package by mouth in the morning and at bedtime.    FLUoxetine (PROZAC) 10 MG capsule Take 10 mg by mouth See admin instructions. Take with 40 ng cap for a total of 50 mg daily    . FLUoxetine (PROZAC) 40 MG capsule Take 40 mg by mouth See admin instructions. Take with 10 mg for a total of 50 mg daily    . imipramine (TOFRANIL) 25 MG tablet Take 2 tablets (50 mg total) by mouth at bedtime. 60 tablet 11  . LORazepam (ATIVAN) 0.5 MG tablet Take 0.5 mg by mouth 3 (three) times daily as needed for anxiety.    . mupirocin ointment (BACTROBAN) 2 % Apply 1 Application  topically 2 (two) times daily. 22 g 0  . nystatin cream (MYCOSTATIN) Apply 1 Application topically 2 (two) times daily. 30 g 0  . ondansetron (ZOFRAN) 4 MG tablet Take 4 mg by mouth every 6 (six) hours as needed for nausea or vomiting.    . promethazine (PHENERGAN) 25 MG tablet Take 25 mg by mouth every 8 (eight) hours as needed for nausea or vomiting.    . rizatriptan (MAXALT-MLT) 10 MG disintegrating tablet Take 1 tablet (10 mg total) by mouth as needed for migraine. May repeat in 2 hours if needed 9 tablet 11  . Vitamin D, Ergocalciferol, (DRISDOL) 1.25 MG (50000 UNIT) CAPS capsule Take 1 capsule (50,000 Units total) by mouth every 7 (seven) days. 5 capsule 3   No current facility-administered medications on file prior to visit.   Past Medical History:  Diagnosis Date  . Anxiety   . Asthma    seasonal asthma  .  Depression    no current problem  . GERD (gastroesophageal reflux disease)    occasional - otc med prn  . Headache   . History of kidney stones    surgery to remove  . Hyperlipidemia    no meds, diet controlled   Past Surgical History:  Procedure Laterality Date  . ABDOMINAL HYSTERECTOMY     47 yo. Total.  . APPENDECTOMY    . ECTOPIC PREGNANCY SURGERY N/A    laparoscopic  . RADIOLOGY WITH ANESTHESIA N/A 07/17/2021   Procedure: MRI WITH ANESTHESIA BRAIN WITH AND WITHOUT CONTRAST, MRI CERVICAL SPINE WITH AND WITHOUT CONTRAST;  Surgeon: Radiologist, Medication, MD;  Location: [facility] OR;  Service: Radiology;  Laterality: N/A;  . removal kidney stone    . TONSILLECTOMY    . WISDOM TOOTH EXTRACTION      Family History  Problem Relation Age of Onset  . Hyperlipidemia Mother   . Diabetes Mother   . Hyperlipidemia Father   . Diabetes Father   . Heart disease Father   . Dementia Father   . Melanoma Father   . Melanoma Sister   . Diabetes Sister   . Hypertension Maternal Grandmother   . Mental illness Maternal Grandmother   . Asthma Paternal Grandfather   . Heart attack Paternal Grandfather   . Arthritis Paternal Grandfather    Social History   Socioeconomic History  . Marital status: Married    Spouse name: Tanja Gift  . Number of children: Not on file  . Years of education: Not on file  . Highest education level: Not on file  Occupational History  . 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 Smoking status: Former    Packs/day: 0.50    Years: 5.00    Total pack years: 2.50    Types: Cigarettes    Quit date: 05/07/2021    Years since quitting: 1.0  . Smokeless tobacco: Never  . Tobacco comments:    Smokes 2-3 cigarettes a day  Vaping Use  . Vaping Use: Never used  Substance and Sexual Activity  . Alcohol use: Never  . Drug use: Never  . Sexual activity: Not Currently    Partners: Male    Birth control/protection: Surgical    Comment:  Hysterectomy  Other Topics Concern  . Not on file  Social History Narrative  . Not on file   Social Determinants of Health   Financial Resource Strain: Low Risk  (01/04/2022)   Overall Financial Resource Strain (CARDIA)   .  Difficulty of Paying Living Expenses: Not hard at all  Food Insecurity: No Food Insecurity (01/04/2022)   Hunger Vital Sign   . Worried About Charity fundraiser in the Last Year: Never true   . Ran Out of Food in the Last Year: Never true  Transportation Needs: No Transportation Needs (01/04/2022)   PRAPARE - Transportation   . Lack of Transportation (Medical): No   . Lack of Transportation (Non-Medical): No  Physical Activity: Inactive (01/04/2022)   Exercise Vital Sign   . Days of Exercise per Week: 0 days   . Minutes of Exercise per Session: 0 min  Stress: No Stress Concern Present (01/04/2022)   [place]   . Feeling of Stress : Not at all  Social Connections: Moderately Isolated (01/04/2022)   Social Connection and Isolation Panel [NHANES]   . Frequency of Communication with Friends and Family: More than three times a week   . Frequency of Social Gatherings with Friends and Family: More than three times a week   . Attends Religious Services: Never   . Active Member of Clubs or Organizations: No   . Attends Archivist Meetings: Never   . Marital Status: Married    Review of Systems  Constitutional:  Negative for chills, fatigue and fever.  HENT:  Negative for congestion, ear pain, rhinorrhea and sore throat.   Respiratory:  Negative for cough and shortness of breath.   Cardiovascular:  Negative for chest pain.  Gastrointestinal:  Negative for abdominal pain, constipation, diarrhea, nausea and vomiting.  Genitourinary:  Negative for dysuria and urgency.  Musculoskeletal:  Negative for back pain and myalgias.  Neurological:  Negative for dizziness, weakness, light-headedness and  headaches.  Psychiatric/Behavioral:  Negative for dysphoric mood. The patient is not nervous/anxious.      Objective:  BP 124/84   Pulse 94   Temp (!) 97.3 F (36.3 C)   Ht 5\' 3"  (1.6 m)   Wt 223 lb (101.2 kg)   SpO2 98%   BMI 39.50 kg/m      06/07/2022    9:58 AM 01/04/2022    8:23 AM 11/19/2021    1:35 PM  BP/Weight  Systolic BP 097 353 299  Diastolic BP 84 84 92  Wt. (Lbs) 223 215 212  BMI 39.5 kg/m2 38.09 kg/m2 37.55 kg/m2    Physical Exam Vitals reviewed.  Constitutional:      Appearance: Normal appearance.  HENT:     Right Ear: Tympanic membrane normal.     Left Ear: Tympanic membrane normal.     Nose: Nose normal.     Mouth/Throat:     Mouth: Mucous membranes are moist.  Cardiovascular:     Rate and Rhythm: Normal rate and regular rhythm.     Pulses: Normal pulses.     Heart sounds: Normal heart sounds.  Pulmonary:     Effort: Pulmonary effort is normal.     Breath sounds: Normal breath sounds.  Abdominal:     General: Bowel sounds are normal.     Palpations: Abdomen is soft.  Skin:    General: Skin is warm and dry.     Capillary Refill: Capillary refill takes less than 2 seconds.  Neurological:     General: No focal deficit present.     Mental Status: She is alert and oriented to person, place, and time.  Psychiatric:        Mood and Affect: Mood normal.  Behavior: Behavior normal.        Lab Results  Component Value Date   WBC 5.3 01/04/2022   HGB 13.9 01/04/2022   HCT 40.9 01/04/2022   PLT 254 01/04/2022   GLUCOSE 117 (H) 01/04/2022   CHOL 253 (H) 01/04/2022   TRIG 189 (H) 01/04/2022   HDL 39 (L) 01/04/2022   LDLCALC 179 (H) 01/04/2022   ALT 54 (H) 01/04/2022   AST 40 01/04/2022   NA 143 01/04/2022   K 4.4 01/04/2022   CL 104 01/04/2022   CREATININE 0.76 01/04/2022   BUN 6 01/04/2022   CO2 22 01/04/2022   TSH 2.320 01/04/2022   HGBA1C 6.0 (H) 01/04/2022      Assessment & Plan:   Silena was seen today for  hyperlipidemia, anxiety and vitamin d def.  Mixed hyperlipidemia  Mild intermittent asthma, unspecified whether complicated  Vitamin D deficiency  GAD (generalized anxiety disorder)     No orders of the defined types were placed in this encounter.   No orders of the defined types were placed in this encounter.    Follow-up: No follow-ups on file.  An After Visit Summary was printed and given to the patient.  Rip Harbour, NP [place] [phone]

## 2022-06-08 ENCOUNTER — Encounter: Payer: Self-pay | Admitting: Nurse Practitioner

## 2022-06-08 LAB — LIPID PANEL
Chol/HDL Ratio: 6.1 ratio — ABNORMAL HIGH (ref 0.0–4.4)
Cholesterol, Total: 280 mg/dL — ABNORMAL HIGH (ref 100–199)
HDL: 46 mg/dL (ref >39)
LDL Chol Calc (NIH): 207 mg/dL — ABNORMAL HIGH (ref 0–99)
Triglycerides: 144 mg/dL (ref 0–149)
VLDL Cholesterol Cal: 27 mg/dL (ref 5–40)

## 2022-06-08 LAB — CBC WITH DIFFERENTIAL/PLATELET
Basophils Absolute: 0.1 10*3/uL (ref 0.0–0.2)
Basos: 1 %
EOS (ABSOLUTE): 0.1 10*3/uL (ref 0.0–0.4)
Eos: 2 %
Hematocrit: 43.7 % (ref 34.0–46.6)
Hemoglobin: 14.6 g/dL (ref 11.1–15.9)
Immature Grans (Abs): 0 10*3/uL (ref 0.0–0.1)
Immature Granulocytes: 1 %
Lymphocytes Absolute: 2.5 10*3/uL (ref 0.7–3.1)
Lymphs: 40 %
MCH: 29.4 pg (ref 26.6–33.0)
MCHC: 33.4 g/dL (ref 31.5–35.7)
MCV: 88 fL (ref 79–97)
Monocytes Absolute: 0.5 10*3/uL (ref 0.1–0.9)
Monocytes: 8 %
Neutrophils Absolute: 3 10*3/uL (ref 1.4–7.0)
Neutrophils: 48 %
Platelets: 318 10*3/uL (ref 150–450)
RBC: 4.97 x10E6/uL (ref 3.77–5.28)
RDW: 12.3 % (ref 11.7–15.4)
WBC: 6.2 10*3/uL (ref 3.4–10.8)

## 2022-06-08 LAB — COMPREHENSIVE METABOLIC PANEL
ALT: 30 IU/L (ref 0–32)
AST: 23 IU/L (ref 0–40)
Albumin/Globulin Ratio: 2 (ref 1.2–2.2)
Albumin: 4.8 g/dL (ref 3.9–4.9)
Alkaline Phosphatase: 105 IU/L (ref 44–121)
BUN/Creatinine Ratio: 10 (ref 9–23)
BUN: 8 mg/dL (ref 6–24)
Bilirubin Total: 0.4 mg/dL (ref 0.0–1.2)
CO2: 23 mmol/L (ref 20–29)
Calcium: 9.6 mg/dL (ref 8.7–10.2)
Chloride: 100 mmol/L (ref 96–106)
Creatinine, Ser: 0.79 mg/dL (ref 0.57–1.00)
Globulin, Total: 2.4 g/dL (ref 1.5–4.5)
Glucose: 99 mg/dL (ref 70–99)
Potassium: 4.5 mmol/L (ref 3.5–5.2)
Sodium: 139 mmol/L (ref 134–144)
Total Protein: 7.2 g/dL (ref 6.0–8.5)
eGFR: 95 mL/min/{1.73_m2} (ref >59)

## 2022-06-08 LAB — VITAMIN D 25 HYDROXY (VIT D DEFICIENCY, FRACTURES): Vit D, 25-Hydroxy: 26.2 ng/mL — ABNORMAL LOW (ref 30.0–100.0)

## 2022-06-08 LAB — CARDIOVASCULAR RISK ASSESSMENT

## 2022-06-12 ENCOUNTER — Encounter: Payer: Self-pay | Admitting: Nurse Practitioner

## 2022-07-09 ENCOUNTER — Ambulatory Visit: Payer: No Typology Code available for payment source | Admitting: Nurse Practitioner

## 2022-08-09 ENCOUNTER — Ambulatory Visit: Payer: No Typology Code available for payment source | Admitting: Nurse Practitioner

## 2022-08-13 ENCOUNTER — Ambulatory Visit (INDEPENDENT_AMBULATORY_CARE_PROVIDER_SITE_OTHER): Payer: No Typology Code available for payment source | Admitting: Family Medicine

## 2022-08-13 ENCOUNTER — Encounter: Payer: Self-pay | Admitting: Family Medicine

## 2022-08-13 VITALS — BP 118/80 | HR 100 | Temp 97.6°F | Ht 63.0 in | Wt 224.0 lb

## 2022-08-13 DIAGNOSIS — E559 Vitamin D deficiency, unspecified: Secondary | ICD-10-CM

## 2022-08-13 DIAGNOSIS — R739 Hyperglycemia, unspecified: Secondary | ICD-10-CM

## 2022-08-13 DIAGNOSIS — Z01818 Encounter for other preprocedural examination: Secondary | ICD-10-CM | POA: Diagnosis not present

## 2022-08-13 DIAGNOSIS — E7849 Other hyperlipidemia: Secondary | ICD-10-CM

## 2022-08-13 DIAGNOSIS — R7303 Prediabetes: Secondary | ICD-10-CM

## 2022-08-13 DIAGNOSIS — E782 Mixed hyperlipidemia: Secondary | ICD-10-CM

## 2022-08-13 MED ORDER — ALBUTEROL SULFATE HFA 108 (90 BASE) MCG/ACT IN AERS: 2.0000 | INHALATION_SPRAY | Freq: Four times a day (QID) | RESPIRATORY_TRACT | 2 refills | Status: DC | PRN

## 2022-08-13 NOTE — Progress Notes (Unsigned)
Subjective:  Patient ID: Kimberly Cabrera, female    DOB: 18-May-1976  Age: 47 y.o. MRN: [id number]  Chief Complaint  Patient presents with   Surgical Clearance    HPI   Chief Complaint  Patient presents with   Surgical Clearance    [place]  is here for a Pre-operative physical at the request of Dr. Tilman Neat.  Patient is getting sonobella tummy procedure. Patient has prediabetes, familial hyperlipidemia, morbid obesity, and vitamin D deficiency. Patient is taking vitamin D weekly.  Resistant to statins, but seems to be agreeing for low dose crestor and coenzyme q10. She would like to return in 6 weeks fasting for A1c, lipids, etc. Her last A1c was 6.1.  Her last LDL was 207. Triglycerides were great. HDL improved.   She  is having elective surgery on 0415/2024.  Personal or family hx of adverse outcome to anesthesia? No  Chipped, cracked, missing, or loose teeth? No  Decreased ROM of neck? No  Able to walk up 2 flights of stairs without becoming significantly short of breath or having chest pain? Yes    Patient Active Problem List   Diagnosis Date Noted   Prediabetes 08/17/2022   Familial hyperlipidemia 08/17/2022   Sore in nose 07/18/2021   Subcutaneous nodule of head 07/18/2021   Elevated blood sugar 07/18/2021   Elevated sed rate 07/18/2021   Neck pain 06/20/2021   Vitamin D deficiency 04/21/2021   Right upper quadrant abdominal tenderness without rebound tenderness 04/02/2021   Mixed hyperlipidemia 03/18/2021   GAD (generalized anxiety disorder) 03/18/2021   Joint pain 03/11/2021   Muscle pain 03/11/2021   Chronic nonintractable headache 03/11/2021   Fatigue 01/25/2021   Past Medical History:  Diagnosis Date   Anxiety    Asthma    seasonal asthma   Depression    no current problem   GERD (gastroesophageal reflux disease)    occasional - otc med prn   Headache    History of kidney stones    surgery to remove   Hyperlipidemia    no meds, diet controlled     Past Surgical History:  Procedure Laterality Date   ABDOMINAL HYSTERECTOMY     47 yo. Total.   APPENDECTOMY     ECTOPIC PREGNANCY SURGERY N/A    laparoscopic   RADIOLOGY WITH ANESTHESIA N/A 07/17/2021   Procedure: MRI WITH ANESTHESIA BRAIN WITH AND WITHOUT CONTRAST, MRI CERVICAL SPINE WITH AND WITHOUT CONTRAST;  Surgeon: Radiologist, Medication, MD;  Location: [place] [facility];  Service: Radiology;  Laterality: N/A;   removal kidney stone     TONSILLECTOMY     WISDOM TOOTH EXTRACTION      Current Outpatient Medications  Medication Sig Dispense Refill   albuterol (VENTOLIN HFA) 108 (90 Base) MCG/ACT inhaler Inhale 2 puffs into the lungs every 6 (six) hours as needed for wheezing or shortness of breath. 18 g 2   FLUoxetine (PROZAC) 20 MG capsule Take 20 mg by mouth daily. Take with Prozac 40 mg to equal 60 mg     Aspirin-Acetaminophen (GOODYS BODY PAIN PO) Take 1 Package by mouth in the morning and at bedtime.     FLUoxetine (PROZAC) 40 MG capsule Take 40 mg by mouth See admin instructions. Take with 20 mg for a total of 60 mg daily     imipramine (TOFRANIL) 25 MG tablet Take 2 tablets (50 mg total) by mouth at bedtime. 60 tablet 11   LORazepam (ATIVAN) 0.5 MG tablet Take 0.5 mg by mouth 3 (  three) times daily as needed for anxiety.     Vitamin D, Ergocalciferol, (DRISDOL) 1.25 MG (50000 UNIT) CAPS capsule Take 1 capsule (50,000 Units total) by mouth every 7 (seven) days. 5 capsule 3   No current facility-administered medications for this visit.    Allergies  Allergen Reactions   Cefdinir Diarrhea    Social History   Socioeconomic History   Marital status: Married    Spouse name: Reylynn Campopiano   Number of children: Not on file   Years of education: Not on file   Highest education level: Not on file  Occupational History   Occupation: Therapist, sports    Comment: [employer] ([place])  Tobacco Use   Smoking status: Former    Packs/day: 0.50    Years: 5.00    Additional pack years: 0.00    Total pack  years: 2.50    Types: Cigarettes    Quit date: 05/07/2021    Years since quitting: 1.2   Smokeless tobacco: Never   Tobacco comments:    Smokes 2-3 cigarettes a day  Vaping Use   Vaping Use: Never used  Substance and Sexual Activity   Alcohol use: Never   Drug use: Never   Sexual activity: Not Currently    Partners: Male    Birth control/protection: Surgical    Comment: Hysterectomy  Other Topics Concern   Not on file  Social History Narrative   Not on file   Social Determinants of Health   Financial Resource Strain: Low Risk  (01/04/2022)   Overall Financial Resource Strain (CARDIA)    Difficulty of Paying Living Expenses: Not hard at all  Food Insecurity: No Food Insecurity (01/04/2022)   Hunger Vital Sign    Worried About Running Out of Food in the Last Year: Never true    [place] in the Last Year: Never true  Transportation Needs: No Transportation Needs (01/04/2022)   PRAPARE - Hydrologist (Medical): No    Lack of Transportation (Non-Medical): No  Physical Activity: Inactive (01/04/2022)   Exercise Vital Sign    Days of Exercise per Week: 0 days    Minutes of Exercise per Session: 0 min  Stress: No Stress Concern Present (01/04/2022)   [place]    Feeling of Stress : Not at all  Social Connections: Moderately Isolated (01/04/2022)   Social Connection and Isolation Panel [NHANES]    Frequency of Communication with Friends and Family: More than three times a week    Frequency of Social Gatherings with Friends and Family: More than three times a week    Attends Religious Services: Never    Marine scientist or Organizations: No    Attends Archivist Meetings: Never    Marital Status: Married  Human resources officer Violence: Not At Risk (01/04/2022)   Humiliation, Afraid, Rape, and Kick questionnaire    Fear of Current or Ex-Partner: No    Emotionally  Abused: No    Physically Abused: No    Sexually Abused: No    Family History  Problem Relation Age of Onset   Hyperlipidemia Mother    Diabetes Mother    Hyperlipidemia Father    Diabetes Father    Heart disease Father    Dementia Father    Melanoma Father    Melanoma Sister    Diabetes Sister    Hypertension Maternal Grandmother    Mental illness Maternal Grandmother  Asthma Paternal Grandfather    Heart attack Paternal Grandfather    Arthritis Paternal Grandfather          06/07/2022   10:02 AM 03/21/2021    8:59 AM  Depression screen PHQ 2/9  Decreased Interest 0 0  Down, Depressed, Hopeless 0 0  PHQ - 2 Score 0 0         03/21/2021    8:54 AM 07/17/2021    6:47 AM 01/04/2022    8:32 AM 06/07/2022   10:02 AM 08/13/2022    2:33 PM  Fall Risk  Falls in the past year? 0  0 0 0  Was there an injury with Fall? 0  0 0 0  Fall Risk Category Calculator 0  0 0 0  Fall Risk Category (Retired) Low  Low Low   (RETIRED) Patient Fall Risk Level Low fall risk Low fall risk Low fall risk Low fall risk   Patient at Risk for Falls Due to No Fall Risks  No Fall Risks No Fall Risks No Fall Risks  Fall risk Follow up Falls evaluation completed  Falls evaluation completed Falls evaluation completed Falls evaluation completed      Review of Systems  Constitutional:  Negative for chills, fatigue and fever.  HENT:  Negative for congestion, ear pain, rhinorrhea and sore throat.   Respiratory:  Negative for cough and shortness of breath.   Cardiovascular:  Negative for chest pain.  Gastrointestinal:  Negative for abdominal pain, constipation, diarrhea, nausea and vomiting.  Genitourinary:  Negative for dysuria and urgency.  Musculoskeletal:  Negative for back pain and myalgias.  Neurological:  Negative for dizziness, weakness, light-headedness and headaches.  Psychiatric/Behavioral:  Negative for dysphoric mood. The patient is not nervous/anxious.     Current Outpatient  Medications on File Prior to Visit  Medication Sig Dispense Refill   FLUoxetine (PROZAC) 20 MG capsule Take 20 mg by mouth daily. Take with Prozac 40 mg to equal 60 mg     Aspirin-Acetaminophen (GOODYS BODY PAIN PO) Take 1 Package by mouth in the morning and at bedtime.     FLUoxetine (PROZAC) 40 MG capsule Take 40 mg by mouth See admin instructions. Take with 20 mg for a total of 60 mg daily     imipramine (TOFRANIL) 25 MG tablet Take 2 tablets (50 mg total) by mouth at bedtime. 60 tablet 11   LORazepam (ATIVAN) 0.5 MG tablet Take 0.5 mg by mouth 3 (three) times daily as needed for anxiety.     Vitamin D, Ergocalciferol, (DRISDOL) 1.25 MG (50000 UNIT) CAPS capsule Take 1 capsule (50,000 Units total) by mouth every 7 (seven) days. 5 capsule 3   No current facility-administered medications on file prior to visit.   Past Medical History:  Diagnosis Date   Anxiety    Asthma    seasonal asthma   Depression    no current problem   GERD (gastroesophageal reflux disease)    occasional - otc med prn   Headache    History of kidney stones    surgery to remove   Hyperlipidemia    no meds, diet controlled   Past Surgical History:  Procedure Laterality Date   ABDOMINAL HYSTERECTOMY     47 yo. Total.   APPENDECTOMY     ECTOPIC PREGNANCY SURGERY N/A    laparoscopic   RADIOLOGY WITH ANESTHESIA N/A 07/17/2021   Procedure: MRI WITH ANESTHESIA BRAIN WITH AND WITHOUT CONTRAST, MRI CERVICAL SPINE WITH AND WITHOUT CONTRAST;  Surgeon:  Radiologist, Medication, MD;  Location: [place];  Service: Radiology;  Laterality: N/A;   removal kidney stone     TONSILLECTOMY     WISDOM TOOTH EXTRACTION      Family History  Problem Relation Age of Onset   Hyperlipidemia Mother    Diabetes Mother    Hyperlipidemia Father    Diabetes Father    Heart disease Father    Dementia Father    Melanoma Father    Melanoma Sister    Diabetes Sister    Hypertension Maternal Grandmother    Mental illness Maternal  Grandmother    Asthma Paternal Grandfather    Heart attack Paternal Grandfather    Arthritis Paternal Grandfather    Social History   Socioeconomic History   Marital status: Married    Spouse name: Monserrath Lothrop   Number of children: Not on file   Years of education: Not on file   Highest education level: Not on file  Occupational History   Occupation: Therapist, sports    Comment: [employer] ([place])  Tobacco Use   Smoking status: Former    Packs/day: 0.50    Years: 5.00    Additional pack years: 0.00    Total pack years: 2.50    Types: Cigarettes    Quit date: 05/07/2021    Years since quitting: 1.2   Smokeless tobacco: Never   Tobacco comments:    Smokes 2-3 cigarettes a day  Vaping Use   Vaping Use: Never used  Substance and Sexual Activity   Alcohol use: Never   Drug use: Never   Sexual activity: Not Currently    Partners: Male    Birth control/protection: Surgical    Comment: Hysterectomy  Other Topics Concern   Not on file  Social History Narrative   Not on file   Social Determinants of Health   Financial Resource Strain: Low Risk  (01/04/2022)   Overall Financial Resource Strain (CARDIA)    Difficulty of Paying Living Expenses: Not hard at all  Food Insecurity: No Food Insecurity (01/04/2022)   Hunger Vital Sign    Worried About Running Out of Food in the Last Year: Never true    Ran Out of Food in the Last Year: Never true  Transportation Needs: No Transportation Needs (01/04/2022)   PRAPARE - Hydrologist (Medical): No    Lack of Transportation (Non-Medical): No  Physical Activity: Inactive (01/04/2022)   Exercise Vital Sign    Days of Exercise per Week: 0 days    Minutes of Exercise per Session: 0 min  Stress: No Stress Concern Present (01/04/2022)   [place]    Feeling of Stress : Not at all  Social Connections: Moderately Isolated (01/04/2022)   Social Connection  and Isolation Panel [NHANES]    Frequency of Communication with Friends and Family: More than three times a week    Frequency of Social Gatherings with Friends and Family: More than three times a week    Attends Religious Services: Never    Printmaker: No    Attends Music therapist: Never    Marital Status: Married    Objective:  BP 118/80   Pulse 100   Temp 97.6 F (36.4 C)   Ht 5\' 3"  (1.6 m)   Wt 224 lb (101.6 kg)   SpO2 99%   BMI 39.68 kg/m      08/13/2022  2:28 PM 06/07/2022    9:58 AM 01/04/2022    8:23 AM  BP/Weight  Systolic BP [id number]  Diastolic BP 80 84 84  Wt. (Lbs) 224 223 215  BMI 39.68 kg/m2 39.5 kg/m2 38.09 kg/m2    Physical Exam Vitals reviewed.  Constitutional:      Appearance: Normal appearance. She is obese.  Neck:     Vascular: No carotid bruit.  Cardiovascular:     Rate and Rhythm: Normal rate and regular rhythm.     Heart sounds: Normal heart sounds.  Pulmonary:     Effort: Pulmonary effort is normal. No respiratory distress.     Breath sounds: Normal breath sounds.  Abdominal:     General: Abdomen is flat. Bowel sounds are normal.     Palpations: Abdomen is soft.     Tenderness: There is no abdominal tenderness.  Neurological:     Mental Status: She is alert and oriented to person, place, and time.  Psychiatric:        Mood and Affect: Mood normal.        Behavior: Behavior normal.     Diabetic Foot Exam - Simple   No data filed      Lab Results  Component Value Date   WBC 6.2 06/07/2022   HGB 14.6 06/07/2022   HCT 43.7 06/07/2022   PLT 318 06/07/2022   GLUCOSE 99 06/07/2022   CHOL 280 (H) 06/07/2022   TRIG 144 06/07/2022   HDL 46 06/07/2022   LDLCALC 207 (H) 06/07/2022   ALT 30 06/07/2022   AST 23 06/07/2022   NA 139 06/07/2022   K 4.5 06/07/2022   CL 100 06/07/2022   CREATININE 0.79 06/07/2022   BUN 8 06/07/2022   CO2 23 06/07/2022   TSH 2.320 01/04/2022   HGBA1C 6.0  (H) 01/04/2022      Assessment & Plan:    Mixed hyperlipidemia Assessment & Plan: Well controlled.  No changes to medicines. Continue crestor and coQ10. Continue to work on eating a healthy diet and exercise.  Labs drawn today.     Vitamin D deficiency Assessment & Plan: Check labs  Orders: -     VITAMIN D 25 Hydroxy (Vit-D Deficiency, Fractures); Future  Prediabetes Assessment & Plan: Hemoglobin A1c 6.0%, 3 month avg of blood sugars, is in prediabetic range.  In order to prevent progression to diabetes, recommend low carb diet and regular exercise   Orders: -     Hemoglobin A1c; Future  Familial hyperlipidemia Assessment & Plan: Check labs  Orders: -     CBC with Differential/Platelet; Future -     Comprehensive metabolic panel; Future -     Lipid panel; Future -     TSH; Future  Other orders -     Albuterol Sulfate HFA; Inhale 2 puffs into the lungs every 6 (six) hours as needed for wheezing or shortness of breath.  Dispense: 18 g; Refill: 2     Meds ordered this encounter  Medications   albuterol (VENTOLIN HFA) 108 (90 Base) MCG/ACT inhaler    Sig: Inhale 2 puffs into the lungs every 6 (six) hours as needed for wheezing or shortness of breath.    Dispense:  18 g    Refill:  2    Orders Placed This Encounter  Procedures   CBC with Differential/Platelet   Comprehensive metabolic panel   Hemoglobin A1c   Lipid panel   TSH   VITAMIN D 25 Hydroxy (Vit-D Deficiency, Fractures)  Follow-up: No follow-ups on file.   I,Katherina A Bramblett,acting as a scribe for Rochel Brome, MD.,have documented all relevant documentation on the behalf of Rochel Brome, MD,as directed by  Rochel Brome, MD while in the presence of Rochel Brome, MD.   Geralynn Ochs I Leal-Borjas,acting as a scribe for Rochel Brome, MD.,have documented all relevant documentation on the behalf of Rochel Brome, MD,as directed by  Rochel Brome, MD while in the presence of Rochel Brome, MD.     An After  Visit Summary was printed and given to the patient.  Rochel Brome, MD [facility] [phone]

## 2022-08-14 ENCOUNTER — Encounter: Payer: Self-pay | Admitting: Family Medicine

## 2022-08-17 DIAGNOSIS — R7303 Prediabetes: Secondary | ICD-10-CM | POA: Insufficient documentation

## 2022-08-17 DIAGNOSIS — E7849 Other hyperlipidemia: Secondary | ICD-10-CM | POA: Insufficient documentation

## 2022-08-17 NOTE — Assessment & Plan Note (Addendum)
Well controlled.  No changes to medicines. Continue crestor and coQ10. Continue to work on eating a healthy diet and exercise.  Labs drawn today.

## 2022-08-17 NOTE — Assessment & Plan Note (Addendum)
>>  ASSESSMENT AND PLAN FOR COMBINED HYPERLIPIDEMIA ASSOCIATED WITH TYPE 2 DIABETES MELLITUS (HCC) WRITTEN ON 04/27/2023 10:13 PM BY LEAL-BORJAS, Quincy Prisco I, CMA  >>ASSESSMENT AND PLAN FOR PREDIABETES WRITTEN ON 08/17/2022  9:50 PM BY LEAL-BORJAS, Soleia Badolato I, CMA  Hemoglobin A1c 6.0%, 3 month avg of blood sugars, is in prediabetic range.  In order to prevent progression to diabetes, recommend low carb diet and regular exercise   >>ASSESSMENT AND PLAN FOR ELEVATED BLOOD SUGAR WRITTEN ON 08/18/2022  1:20 PM BY COX, KIRSTEN, MD  Check A1c in 6 weeks.    >>ASSESSMENT AND PLAN FOR MIXED HYPERLIPIDEMIA WRITTEN ON 08/17/2022  9:50 PM BY LEAL-BORJAS, Jamal Haskin I, CMA  Well controlled.  No changes to medicines. Continue crestor and coQ10. Continue to work on eating a healthy diet and exercise.  Labs drawn today.

## 2022-08-17 NOTE — Assessment & Plan Note (Signed)
Recommend continue to work on eating healthy diet and exercise. Check labs in 6 weeks.  Plan to restart crestor with coenzyme q10 following next labs unless drastic improvement.

## 2022-08-17 NOTE — Assessment & Plan Note (Signed)
Check labs 

## 2022-08-18 DIAGNOSIS — Z01818 Encounter for other preprocedural examination: Secondary | ICD-10-CM | POA: Insufficient documentation

## 2022-08-18 NOTE — Assessment & Plan Note (Signed)
Cleared for surgery 

## 2022-08-18 NOTE — Assessment & Plan Note (Signed)
Check A1c in 6 weeks.

## 2022-08-26 ENCOUNTER — Telehealth: Payer: Self-pay

## 2022-08-26 NOTE — Telephone Encounter (Signed)
Patient called this morning to see about getting an appointment for a cat bite. Patient notified that we do not have an opening for today. The patient stated that she will go to [facility] to be seen.

## 2022-09-09 HISTORY — PX: LIPOSUCTION: SHX10

## 2022-09-20 ENCOUNTER — Other Ambulatory Visit: Payer: No Typology Code available for payment source

## 2022-09-24 ENCOUNTER — Ambulatory Visit: Payer: No Typology Code available for payment source | Admitting: Family Medicine

## 2022-09-24 ENCOUNTER — Other Ambulatory Visit: Payer: Self-pay | Admitting: Family Medicine

## 2022-09-25 ENCOUNTER — Other Ambulatory Visit: Payer: Self-pay

## 2022-09-25 DIAGNOSIS — E559 Vitamin D deficiency, unspecified: Secondary | ICD-10-CM

## 2022-09-25 MED ORDER — VITAMIN D (ERGOCALCIFEROL) 1.25 MG (50000 UNIT) PO CAPS: 50000.0000 [IU] | ORAL_CAPSULE | ORAL | 3 refills | Status: DC

## 2022-09-27 ENCOUNTER — Other Ambulatory Visit: Payer: No Typology Code available for payment source

## 2022-09-27 DIAGNOSIS — R7303 Prediabetes: Secondary | ICD-10-CM

## 2022-09-27 DIAGNOSIS — E559 Vitamin D deficiency, unspecified: Secondary | ICD-10-CM

## 2022-09-27 DIAGNOSIS — E7849 Other hyperlipidemia: Secondary | ICD-10-CM

## 2022-09-28 LAB — CBC WITH DIFFERENTIAL/PLATELET
Basophils Absolute: 0.1 10*3/uL (ref 0.0–0.2)
Basos: 2 %
EOS (ABSOLUTE): 0.3 10*3/uL (ref 0.0–0.4)
Eos: 5 %
Hematocrit: 38.8 % (ref 34.0–46.6)
Hemoglobin: 12.5 g/dL (ref 11.1–15.9)
Immature Grans (Abs): 0 10*3/uL (ref 0.0–0.1)
Immature Granulocytes: 0 %
Lymphocytes Absolute: 2.4 10*3/uL (ref 0.7–3.1)
Lymphs: 36 %
MCH: 28.2 pg (ref 26.6–33.0)
MCHC: 32.2 g/dL (ref 31.5–35.7)
MCV: 87 fL (ref 79–97)
Monocytes Absolute: 0.6 10*3/uL (ref 0.1–0.9)
Monocytes: 9 %
Neutrophils Absolute: 3.3 10*3/uL (ref 1.4–7.0)
Neutrophils: 48 %
Platelets: 374 10*3/uL (ref 150–450)
RBC: 4.44 x10E6/uL (ref 3.77–5.28)
RDW: 13.1 % (ref 11.7–15.4)
WBC: 6.7 10*3/uL (ref 3.4–10.8)

## 2022-09-28 LAB — COMPREHENSIVE METABOLIC PANEL
ALT: 29 IU/L (ref 0–32)
AST: 20 IU/L (ref 0–40)
Albumin/Globulin Ratio: 1.7 (ref 1.2–2.2)
Albumin: 4.5 g/dL (ref 3.9–4.9)
Alkaline Phosphatase: 102 IU/L (ref 44–121)
BUN/Creatinine Ratio: 17 (ref 9–23)
BUN: 13 mg/dL (ref 6–24)
Bilirubin Total: 0.4 mg/dL (ref 0.0–1.2)
CO2: 21 mmol/L (ref 20–29)
Calcium: 9.2 mg/dL (ref 8.7–10.2)
Chloride: 102 mmol/L (ref 96–106)
Creatinine, Ser: 0.78 mg/dL (ref 0.57–1.00)
Globulin, Total: 2.6 g/dL (ref 1.5–4.5)
Glucose: 129 mg/dL — ABNORMAL HIGH (ref 70–99)
Potassium: 4.6 mmol/L (ref 3.5–5.2)
Sodium: 141 mmol/L (ref 134–144)
Total Protein: 7.1 g/dL (ref 6.0–8.5)
eGFR: 95 mL/min/{1.73_m2} (ref >59)

## 2022-09-28 LAB — TSH: TSH: 1.67 u[IU]/mL (ref 0.450–4.500)

## 2022-09-28 LAB — HEMOGLOBIN A1C
Est. average glucose Bld gHb Est-mCnc: 131 mg/dL
Hgb A1c MFr Bld: 6.2 % — ABNORMAL HIGH (ref 4.8–5.6)

## 2022-09-28 LAB — CARDIOVASCULAR RISK ASSESSMENT

## 2022-09-28 LAB — LIPID PANEL
Chol/HDL Ratio: 5.3 ratio — ABNORMAL HIGH (ref 0.0–4.4)
Cholesterol, Total: 228 mg/dL — ABNORMAL HIGH (ref 100–199)
HDL: 43 mg/dL (ref >39)
LDL Chol Calc (NIH): 153 mg/dL — ABNORMAL HIGH (ref 0–99)
Triglycerides: 176 mg/dL — ABNORMAL HIGH (ref 0–149)
VLDL Cholesterol Cal: 32 mg/dL (ref 5–40)

## 2022-09-28 LAB — VITAMIN D 25 HYDROXY (VIT D DEFICIENCY, FRACTURES): Vit D, 25-Hydroxy: 34.6 ng/mL (ref 30.0–100.0)

## 2022-09-30 ENCOUNTER — Other Ambulatory Visit: Payer: Self-pay | Admitting: Family Medicine

## 2022-10-01 ENCOUNTER — Encounter: Payer: Self-pay | Admitting: Neurology

## 2022-10-01 ENCOUNTER — Encounter: Payer: Self-pay | Admitting: Family Medicine

## 2022-10-01 ENCOUNTER — Ambulatory Visit: Payer: No Typology Code available for payment source | Admitting: Family Medicine

## 2022-10-01 VITALS — BP 136/82 | HR 106 | Temp 97.1°F | Ht 63.0 in | Wt 210.0 lb

## 2022-10-01 DIAGNOSIS — R7303 Prediabetes: Secondary | ICD-10-CM

## 2022-10-01 DIAGNOSIS — Z6837 Body mass index (BMI) 37.0-37.9, adult: Secondary | ICD-10-CM

## 2022-10-01 DIAGNOSIS — E559 Vitamin D deficiency, unspecified: Secondary | ICD-10-CM | POA: Diagnosis not present

## 2022-10-01 DIAGNOSIS — F411 Generalized anxiety disorder: Secondary | ICD-10-CM | POA: Diagnosis not present

## 2022-10-01 DIAGNOSIS — E782 Mixed hyperlipidemia: Secondary | ICD-10-CM | POA: Diagnosis not present

## 2022-10-01 DIAGNOSIS — E538 Deficiency of other specified B group vitamins: Secondary | ICD-10-CM

## 2022-10-01 DIAGNOSIS — M542 Cervicalgia: Secondary | ICD-10-CM | POA: Diagnosis not present

## 2022-10-01 MED ORDER — KETOROLAC TROMETHAMINE 60 MG/2ML IM SOLN
60.0000 mg | Freq: Once | INTRAMUSCULAR | Status: AC
Start: 2022-10-01 — End: 2022-10-01
  Administered 2022-10-01: 60 mg via INTRAMUSCULAR

## 2022-10-01 NOTE — Assessment & Plan Note (Addendum)
>>  ASSESSMENT AND PLAN FOR COMBINED HYPERLIPIDEMIA ASSOCIATED WITH TYPE 2 DIABETES MELLITUS (HCC) WRITTEN ON 10/01/2022 11:55 AM BY Yovanny Coats A, CMA  Recommend continue to work on eating healthy diet and exercise.    >>ASSESSMENT AND PLAN FOR MIXED HYPERLIPIDEMIA WRITTEN ON 10/06/2022  7:24 PM BY COX, KIRSTEN, MD  Recommend statin medicine.  Continue to work on eating a healthy diet and exercise.  Labs drawn today.

## 2022-10-01 NOTE — Assessment & Plan Note (Signed)
Increased to twice a week.

## 2022-10-01 NOTE — Assessment & Plan Note (Signed)
Will check B12 

## 2022-10-01 NOTE — Progress Notes (Signed)
Subjective:  Patient ID: Kimberly Cabrera, female    DOB: Aug 22, 1975  Age: 47 y.o. MRN: [id number]  Chief Complaint  Patient presents with   Medical Management of Chronic Issues    HPI   Anxiety: Prozac 60 mg daily, Ativan 0.5 twice daily  Vitamin D Def: vitamin D 50K weekly  Neck pain: currently taking baclofen and imipramine 50 mg qhs has appt with Neurologist tomorrow. Pain 7/10  Prediabetes: Last A1C 6.2   Cholesterol: Diet modifications. TC 228, TG 176, HDL 43, LDL 153     10/01/2022   11:43 AM 06/07/2022   10:02 AM 03/21/2021    8:59 AM  Depression screen PHQ 2/9  Decreased Interest 0 0 0  Down, Depressed, Hopeless 0 0 0  PHQ - 2 Score 0 0 0  Altered sleeping 1    Tired, decreased energy 1    Change in appetite 0    Feeling bad or failure about yourself  0    Trouble concentrating 0    Moving slowly or fidgety/restless 0    Suicidal thoughts 0    PHQ-9 Score 2    Difficult doing work/chores Not difficult at all          10/01/2022   11:43 AM  Fall Risk   Falls in the past year? 0  Number falls in past yr: 0  Injury with Fall? 0  Risk for fall due to : No Fall Risks  Follow up Falls evaluation completed    Patient Care Team: Blane Ohara, MD as PCP - General (Family Medicine) Sater, Pearletha Furl, MD (Neurology)   Review of Systems  Constitutional:  Negative for chills, fatigue and fever.  HENT:  Negative for congestion, ear pain, rhinorrhea and sore throat.   Respiratory:  Negative for cough and shortness of breath.   Cardiovascular:  Negative for chest pain.  Gastrointestinal:  Negative for abdominal pain, constipation, diarrhea, nausea and vomiting.  Genitourinary:  Negative for dysuria and urgency.  Musculoskeletal:  Negative for back pain and myalgias.  Neurological:  Negative for dizziness, weakness, light-headedness and headaches.  Psychiatric/Behavioral:  Negative for dysphoric mood. The patient is not nervous/anxious.     Current Outpatient  Medications on File Prior to Visit  Medication Sig Dispense Refill   baclofen (LIORESAL) 10 MG tablet Take 10 mg by mouth 3 (three) times daily.     Aspirin-Acetaminophen (GOODYS BODY PAIN PO) Take 1 Package by mouth in the morning and at bedtime. (Patient not taking: Reported on 10/02/2022)     FLUoxetine (PROZAC) 20 MG capsule Take 20 mg by mouth daily. Take with Prozac 40 mg to equal 60 mg     FLUoxetine (PROZAC) 40 MG capsule Take 40 mg by mouth See admin instructions. Take with 20 mg for a total of 60 mg daily     imipramine (TOFRANIL) 25 MG tablet Take 2 tablets (50 mg total) by mouth at bedtime. (Patient taking differently: Take 50 mg by mouth at bedtime. 1 Tablet) 60 tablet 11   LORazepam (ATIVAN) 0.5 MG tablet Take 0.5 mg by mouth 2 (two) times daily.     Vitamin D, Ergocalciferol, (DRISDOL) 1.25 MG (50000 UNIT) CAPS capsule Take 1 capsule (50,000 Units total) by mouth every 7 (seven) days. 5 capsule 3   No current facility-administered medications on file prior to visit.   Past Medical History:  Diagnosis Date   Anxiety    Asthma    seasonal asthma   Depression  no current problem   GERD (gastroesophageal reflux disease)    occasional - otc med prn   Headache    History of kidney stones    surgery to remove   Hyperlipidemia    no meds, diet controlled   Past Surgical History:  Procedure Laterality Date   abdominal contouring with liposuction and skin resection.     08/2022   ABDOMINAL HYSTERECTOMY     47 yo. Total.   APPENDECTOMY     ECTOPIC PREGNANCY SURGERY N/A    laparoscopic   LIPOSUCTION  09/09/2022   RADIOLOGY WITH ANESTHESIA N/A 07/17/2021   Procedure: MRI WITH ANESTHESIA BRAIN WITH AND WITHOUT CONTRAST, MRI CERVICAL SPINE WITH AND WITHOUT CONTRAST;  Surgeon: Radiologist, Medication, MD;  Location: [facility] OR;  Service: Radiology;  Laterality: N/A;   removal kidney stone     TONSILLECTOMY     WISDOM TOOTH EXTRACTION      Family History  Problem Relation Age of  Onset   Hyperlipidemia Mother    Diabetes Mother    Hyperlipidemia Father    Diabetes Father    Heart disease Father    Dementia Father    Melanoma Father    Melanoma Sister    Diabetes Sister    Hypertension Maternal Grandmother    Mental illness Maternal Grandmother    Asthma Paternal Grandfather    Heart attack Paternal Grandfather    Arthritis Paternal Grandfather    Social History   Socioeconomic History   Marital status: Married    Spouse name: Malia Bem   Number of children: Not on file   Years of education: Not on file   Highest education level: Associate degree: occupational, Scientist, product/process development, or vocational program  Occupational History   Occupation: Charity fundraiser    Comment: [employer] ([place])  Tobacco Use   Smoking status: Former    Packs/day: 0.50    Years: 5.00    Additional pack years: 0.00    Total pack years: 2.50    Types: Cigarettes    Quit date: 05/07/2021    Years since quitting: 1.4   Smokeless tobacco: Never   Tobacco comments:    Smokes 2-3 cigarettes a day  Vaping Use   Vaping Use: Never used  Substance and Sexual Activity   Alcohol use: Never   Drug use: Never   Sexual activity: Not Currently    Partners: Male    Birth control/protection: Surgical    Comment: Hysterectomy  Other Topics Concern   Not on file  Social History Narrative   Right Handed   1 Can of Soda per Day   Social Determinants of Health   Financial Resource Strain: Low Risk  (09/27/2022)   Overall Financial Resource Strain (CARDIA)    Difficulty of Paying Living Expenses: Not hard at all  Food Insecurity: No Food Insecurity (09/27/2022)   Hunger Vital Sign    Worried About Running Out of Food in the Last Year: Never true    Ran Out of Food in the Last Year: Never true  Transportation Needs: No Transportation Needs (09/27/2022)   PRAPARE - Administrator, Civil Service (Medical): No    Lack of Transportation (Non-Medical): No  Physical Activity: Insufficiently Active  (09/27/2022)   Exercise Vital Sign    Days of Exercise per Week: 3 days    Minutes of Exercise per Session: 20 min  Stress: No Stress Concern Present (09/27/2022)   [employer] of Occupational Health - Occupational Stress Questionnaire  Feeling of Stress : Only a little  Social Connections: Socially Isolated (10/01/2022)   Social Connection and Isolation Panel [NHANES]    Frequency of Communication with Friends and Family: Twice a week    Frequency of Social Gatherings with Friends and Family: Never    Attends Religious Services: Never    Diplomatic Services operational officer: No    Attends Engineer, structural: Never    Marital Status: Married    Objective:  BP 136/82   Pulse (!) 106   Temp (!) 97.1 F (36.2 C)   Ht 5\' 3"  (1.6 m)   Wt 210 lb (95.3 kg)   SpO2 98%   BMI 37.20 kg/m      10/02/2022   10:37 AM 10/01/2022   11:39 AM 08/13/2022    2:28 PM  BP/Weight  Systolic BP 124 136 118  Diastolic BP 83 82 80  Wt. (Lbs) 212.5 210 224  BMI 37.64 kg/m2 37.2 kg/m2 39.68 kg/m2    Physical Exam Vitals reviewed.  Constitutional:      Appearance: Normal appearance. She is normal weight.  Neck:     Vascular: No carotid bruit.  Cardiovascular:     Rate and Rhythm: Normal rate and regular rhythm.     Heart sounds: Normal heart sounds.  Pulmonary:     Effort: Pulmonary effort is normal. No respiratory distress.     Breath sounds: Normal breath sounds.  Abdominal:     General: Abdomen is flat. Bowel sounds are normal.     Palpations: Abdomen is soft.     Tenderness: There is no abdominal tenderness.       Comments: Healing incision  Neurological:     Mental Status: She is alert and oriented to person, place, and time.  Psychiatric:        Mood and Affect: Mood normal.        Behavior: Behavior normal.     Diabetic Foot Exam - Simple   No data filed      Lab Results  Component Value Date   WBC 6.7 09/27/2022   HGB 12.5 09/27/2022   HCT 38.8  09/27/2022   PLT 374 09/27/2022   GLUCOSE 129 (H) 09/27/2022   CHOL 228 (H) 09/27/2022   TRIG 176 (H) 09/27/2022   HDL 43 09/27/2022   LDLCALC 153 (H) 09/27/2022   ALT 29 09/27/2022   AST 20 09/27/2022   NA 141 09/27/2022   K 4.6 09/27/2022   CL 102 09/27/2022   CREATININE 0.78 09/27/2022   BUN 13 09/27/2022   CO2 21 09/27/2022   TSH 1.670 09/27/2022   HGBA1C 6.2 (H) 09/27/2022      Assessment & Plan:    Vitamin D deficiency Assessment & Plan: Increased to twice a week.   Prediabetes Assessment & Plan: Recommend continue to work on eating healthy diet and exercise.    Mixed hyperlipidemia Assessment & Plan: Recommend statin medicine.  Continue to work on eating a healthy diet and exercise.  Labs drawn today.     GAD (generalized anxiety disorder) Assessment & Plan: Continue to see Counselor. Trying to wean off xanax.   Neck pain Assessment & Plan: Continue to see Neurology  Orders: -     Ketorolac Tromethamine  B12 deficiency Assessment & Plan: Will check B12  Orders: -     B12 and Folate Panel  Class 2 severe obesity due to excess calories with serious comorbidity and body mass index (BMI) of 37.0 to  37.9 in adult [facility]) Assessment & Plan: Recommend continue to work on eating healthy diet and exercise.  Comorbidities: Prediabetes, hyperlipidemia, GAD.      Meds ordered this encounter  Medications   ketorolac (TORADOL) injection 60 mg    Orders Placed This Encounter  Procedures   B12 and Folate Panel     Follow-up: Return in about 3 months (around 01/01/2023) for chronic, fasting.   I,Katherina A Bramblett,acting as a scribe for Blane Ohara, MD.,have documented all relevant documentation on the behalf of Blane Ohara, MD,as directed by  Blane Ohara, MD while in the presence of Blane Ohara, MD.   An After Visit Summary was printed and given to the patient.  Blane Ohara, MD [facility] [phone]

## 2022-10-01 NOTE — Assessment & Plan Note (Signed)
Continue to see Counselor. Trying to wean off xanax.

## 2022-10-01 NOTE — Assessment & Plan Note (Signed)
Continue to see Neurology

## 2022-10-02 ENCOUNTER — Encounter: Payer: Self-pay | Admitting: Neurology

## 2022-10-02 ENCOUNTER — Ambulatory Visit (INDEPENDENT_AMBULATORY_CARE_PROVIDER_SITE_OTHER): Payer: No Typology Code available for payment source | Admitting: Neurology

## 2022-10-02 VITALS — BP 124/83 | HR 98 | Ht 63.0 in | Wt 212.5 lb

## 2022-10-02 DIAGNOSIS — G8929 Other chronic pain: Secondary | ICD-10-CM | POA: Diagnosis not present

## 2022-10-02 DIAGNOSIS — M2559 Pain in other specified joint: Secondary | ICD-10-CM

## 2022-10-02 DIAGNOSIS — R5383 Other fatigue: Secondary | ICD-10-CM

## 2022-10-02 DIAGNOSIS — R519 Headache, unspecified: Secondary | ICD-10-CM | POA: Diagnosis not present

## 2022-10-02 DIAGNOSIS — M542 Cervicalgia: Secondary | ICD-10-CM | POA: Diagnosis not present

## 2022-10-02 LAB — B12 AND FOLATE PANEL
Folate: 14.3 ng/mL (ref >3.0)
Vitamin B-12: 520 pg/mL (ref 232–1245)

## 2022-10-02 MED ORDER — GABAPENTIN 300 MG PO CAPS: 300.0000 mg | ORAL_CAPSULE | Freq: Three times a day (TID) | ORAL | 2 refills | Status: DC

## 2022-10-02 NOTE — Progress Notes (Signed)
[facility]  PATIENT: Kimberly Cabrera DOB: 1976-05-06  REFERRING DOCTOR OR PCP: Blane Ohara, MD SOURCE: Notes from primary care, rheumatology  _________________________________   HISTORICAL  CHIEF COMPLAINT:  Chief Complaint  Patient presents with   Room 11    Pt is here with her Husband. Pt states that Monday morning and he couldn't move her neck or her right side. Pt states that went to urgent care and got an muscle relaxer, she has also done ice and heat, as well as lidocaine cream and nothing has helped. Pt states that she got a Toradol shot and it helped for 10 hours and the pain is back this morning.     HISTORY OF PRESENT ILLNESS:  Kimberly, Cabrera a 47 y.o. woman with neck pain, headaches and other symptoms.  Update 10/02/2022:  Last week, she had the onset of severe neck apin with stiffness.   Baclofen was prescrbed y urgent care but did not help.   There were no definite triggers.  She had some pain earlier in April associated with an abdominal laser fat removal (conscious sedation but not general - she remained awake).    She was on gabapentin and pain pills for a few days afterwards (not on now).     Heat, ice, lidocaine ointment, NSAIDs have not helped  She only has a mild headache.    The occiput is tender  Headaches had been doing very well on imipramine but for the last 4 days, she has had a severe migraine with HA, neck pain, Nausea, photophobia/phonophobia.    She took phenergan and Zofran and had no vomiting.      Since last visit, she did have MRI of the brain and cervical spine 07/17/2021.  The brain was normal for age with just 2 punctate T2/FLAIR hyperintense foci.  The cervical spine just showed very minimal disc bulges.  No spinal stenosis or nerve root compression or significant bony changes  Unlikely headache in the past, she is not experiencing facial numbness.  CT scan of the head that she had in 2022 was normal.  She had been unable to  obtain an MRI due to a panic attack.  She is scheduled to have conscious sedation for an MRI of the brain and cervical spine at the hospital.   Her tremor has resolved.  Since the tick bite she also has had constant pain in her legs and hips.  She saw rheumatology but no rheumatologic issue was discovered.  Labs showed mildly elevated ESR but the CRP was normal.  ANA was normal.  History of neurologic symptoms: She has had neurologic symptoms and severe fatigue since a tick bite 09/2019.  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   She did a two week course of doxycycline.   The fatigue never improved.   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    She physically feels poor.      She wakes up a couple times every night.   She snores.  Husband has not noted OSA signs   She has gained 50 pounds in last few years.  She has mild excessive daytime sleepiness.  She has not yet done the home sleep study.  EPWORTH SLEEPINESS SCALE  On a scale of 0 - 3 what is the chance of dozing:  Sitting and Reading:   3 Watching TV:    3 Sitting inactive  in a public place: 0 Passenger in car for one hour: 0 Lying down to rest in the afternoon: 3 Sitting and talking to someone: 0 Sitting quietly after lunch:  3 In a car, stopped in traffic:  0  Total (out of 24):   12/24 mild EDS.      Data: 03/21/2021:  B12 was normal.  Vit D was 13.2 (taking 30865 weekly suppl), elevated cholesterol = 273, LDL = 186.   CMP, CBC/D were ok.    05/29/2021: ESR was mildly elevated (39), CRP, ANA were normal.  01/11/2021: CT scan was normal.  MRI of the brain and cervical spine 07/17/2021.  The brain was normal for age with just 2 punctate T2/FLAIR hyperintense foci.  The cervical spine just showed very minimal disc bulges.  No spinal stenosis or nerve root compression or significant bony changes  Vascular risk factors:   She stopped smoking (she was 1ppd x 15).  She  has hyperlipidemia and has changed her diet and started Crestor.  She does not have hypertension or diabetes.   REVIEW OF SYSTEMS: Constitutional: No fevers, chills, sweats, or change in appetite Eyes: No visual changes, double vision, eye pain Ear, nose and throat: No hearing loss, ear pain, nasal congestion, sore throat Cardiovascular: No chest pain, palpitations Respiratory:  No shortness of breath at rest or with exertion.   No wheezes GastrointestinaI: No nausea, vomiting, diarrhea, abdominal pain, fecal incontinence Genitourinary:  No dysuria, urinary retention or frequency.  No nocturia. Musculoskeletal:  No neck pain, back pain Integumentary: No rash, pruritus, skin lesions Neurological: as above Psychiatric: No depression at this time.  No anxiety Endocrine: No palpitations, diaphoresis, change in appetite, change in weigh or increased thirst Hematologic/Lymphatic:  No anemia, purpura, petechiae. Allergic/Immunologic: No itchy/runny eyes, nasal congestion, recent allergic reactions, rashes  ALLERGIES: Allergies  Allergen Reactions   Cefdinir Diarrhea    HOME MEDICATIONS:  Current Outpatient Medications:    baclofen (LIORESAL) 10 MG tablet, Take 10 mg by mouth 3 (three) times daily., Disp: , Rfl:    FLUoxetine (PROZAC) 20 MG capsule, Take 20 mg by mouth daily. Take with Prozac 40 mg to equal 60 mg, Disp: , Rfl:    FLUoxetine (PROZAC) 40 MG capsule, Take 40 mg by mouth See admin instructions. Take with 20 mg for a total of 60 mg daily, Disp: , Rfl:    imipramine (TOFRANIL) 25 MG tablet, Take 2 tablets (50 mg total) by mouth at bedtime. (Patient taking differently: Take 50 mg by mouth at bedtime. 1 Tablet), Disp: 60 tablet, Rfl: 11   LORazepam (ATIVAN) 0.5 MG tablet, Take 0.5 mg by mouth 2 (two) times daily., Disp: , Rfl:    Vitamin D, Ergocalciferol, (DRISDOL) 1.25 MG (50000 UNIT) CAPS capsule, Take 1 capsule (50,000 Units total) by mouth every 7 (seven) days., Disp: 5  capsule, Rfl: 3   Aspirin-Acetaminophen (GOODYS BODY PAIN PO), Take 1 Package by mouth in the morning and at bedtime. (Patient not taking: Reported on 10/02/2022), Disp: , Rfl:   PAST MEDICAL HISTORY: Past Medical History:  Diagnosis Date   Anxiety    Asthma    seasonal asthma   Depression    no current problem   GERD (gastroesophageal reflux disease)    occasional - otc med prn   Headache    History of kidney stones    surgery to remove   Hyperlipidemia    no meds, diet controlled    PAST SURGICAL HISTORY: Past Surgical History:  Procedure Laterality Date   abdominal contouring with liposuction and skin resection.     08/2022   ABDOMINAL HYSTERECTOMY     47 yo. Total.   APPENDECTOMY     ECTOPIC PREGNANCY SURGERY N/A    laparoscopic   LIPOSUCTION  09/09/2022   RADIOLOGY WITH ANESTHESIA N/A 07/17/2021   Procedure: MRI WITH ANESTHESIA BRAIN WITH AND WITHOUT CONTRAST, MRI CERVICAL SPINE WITH AND WITHOUT CONTRAST;  Surgeon: Radiologist, Medication, MD;  Location: [facility] OR;  Service: Radiology;  Laterality: N/A;   removal kidney stone     TONSILLECTOMY     WISDOM TOOTH EXTRACTION      FAMILY HISTORY: Family History  Problem Relation Age of Onset   Hyperlipidemia Mother    Diabetes Mother    Hyperlipidemia Father    Diabetes Father    Heart disease Father    Dementia Father    Melanoma Father    Melanoma Sister    Diabetes Sister    Hypertension Maternal Grandmother    Mental illness Maternal Grandmother    Asthma Paternal Grandfather    Heart attack Paternal Grandfather    Arthritis Paternal Grandfather     SOCIAL HISTORY:  Social History   Socioeconomic History   Marital status: Married    Spouse name: Crysti Manigo   Number of children: Not on file   Years of education: Not on file   Highest education level: Associate degree: occupational, Scientist, product/process development, or vocational program  Occupational History   Occupation: Charity fundraiser    Comment: [employer] ([place])  Tobacco  Use   Smoking status: Former    Packs/day: 0.50    Years: 5.00    Additional pack years: 0.00    Total pack years: 2.50    Types: Cigarettes    Quit date: 05/07/2021    Years since quitting: 1.4   Smokeless tobacco: Never   Tobacco comments:    Smokes 2-3 cigarettes a day  Vaping Use   Vaping Use: Never used  Substance and Sexual Activity   Alcohol use: Never   Drug use: Never   Sexual activity: Not Currently    Partners: Male    Birth control/protection: Surgical    Comment: Hysterectomy  Other Topics Concern   Not on file  Social History Narrative   Right Handed   1 Can of Soda per Day   Social Determinants of Health   Financial Resource Strain: Low Risk  (09/27/2022)   Overall Financial Resource Strain (CARDIA)    Difficulty of Paying Living Expenses: Not hard at all  Food Insecurity: No Food Insecurity (09/27/2022)   Hunger Vital Sign    Worried About Running Out of Food in the Last Year: Never true    Ran Out of Food in the Last Year: Never true  Transportation Needs: No Transportation Needs (09/27/2022)   PRAPARE - Administrator, Civil Service (Medical): No    Lack of Transportation (Non-Medical): No  Physical Activity: Insufficiently Active (09/27/2022)   Exercise Vital Sign    Days of Exercise per Week: 3 days    Minutes of Exercise per Session: 20 min  Stress: No Stress Concern Present (09/27/2022)   [employer] of Occupational Health - Occupational Stress Questionnaire    Feeling of Stress : Only a little  Social Connections: Socially Isolated (10/01/2022)   Social Connection and Isolation Panel [NHANES]    Frequency of Communication with Friends and Family: Twice a week    Frequency of Social Gatherings with Friends  and Family: Never    Attends Religious Services: Never    Active Member of Clubs or Organizations: No    Attends Banker Meetings: Never    Marital Status: Married  Catering manager Violence: Not At Risk (01/04/2022)    Humiliation, Afraid, Rape, and Kick questionnaire    Fear of Current or Ex-Partner: No    Emotionally Abused: No    Physically Abused: No    Sexually Abused: No     PHYSICAL EXAM  Vitals:   10/02/22 1037  BP: 124/83  Pulse: 98  Weight: 212 lb 8 oz (96.4 kg)  Height: 5\' 3"  (1.6 m)     Body mass index is 37.64 kg/m.  No results found.     General: The patient is well-developed and well-nourished and in no acute distress  HEENT:  Head is [place]/AT.  Sclera are anicteric.  Funduscopic exam shows normal optic discs and retinal vessels.  Neck: Reduced range of motion in neck (improved after injection).  The neck is tender over left more than right occiput.  Also tender over cervical paraspinal muscles.  Cardiovascular: The heart has a regular rate and rhythm with a normal S1 and S2. There were no murmurs, gallops or rubs.    Skin: Extremities are without rash or  edema.  Musculoskeletal:  Back is nontender  Neurologic Exam  Mental status: The patient is alert and oriented x 3 at the time of the examination. The patient has apparent normal recent and remote memory, with an apparently normal attention span and concentration ability.   Speech is normal.  Cranial nerves: Extraocular movements are full.  Facial strength and sensation was normal.  No obvious hearing deficits are noted.  Motor:  Muscle bulk is normal.   Tone is normal. Strength is  5 / 5 in all 4 extremities.   Coordination: Cerebellar testing reveals good finger-nose-finger and heel-to-shin bilaterally.  Gait and station: Station is normal.   Gait is normal. Tandem gait is mildly wide.    Reflexes: Deep tendon reflexes are symmetric and normal, 2 in arms and 3 in legs.       DIAGNOSTIC DATA (LABS, IMAGING, TESTING) - I reviewed patient records, labs, notes, testing and imaging myself where available.  Lab Results  Component Value Date   WBC 6.7 09/27/2022   HGB 12.5 09/27/2022   HCT 38.8 09/27/2022    MCV 87 09/27/2022   PLT 374 09/27/2022      Component Value Date/Time   NA 141 09/27/2022 0806   K 4.6 09/27/2022 0806   CL 102 09/27/2022 0806   CO2 21 09/27/2022 0806   GLUCOSE 129 (H) 09/27/2022 0806   BUN 13 09/27/2022 0806   CREATININE 0.78 09/27/2022 0806   CALCIUM 9.2 09/27/2022 0806   PROT 7.1 09/27/2022 0806   ALBUMIN 4.5 09/27/2022 0806   AST 20 09/27/2022 0806   ALT 29 09/27/2022 0806   ALKPHOS 102 09/27/2022 0806   BILITOT 0.4 09/27/2022 0806   Lab Results  Component Value Date   CHOL 228 (H) 09/27/2022   HDL 43 09/27/2022   LDLCALC 153 (H) 09/27/2022   TRIG 176 (H) 09/27/2022   CHOLHDL 5.3 (H) 09/27/2022   Lab Results  Component Value Date   HGBA1C 6.2 (H) 09/27/2022   Lab Results  Component Value Date   VITAMINB12 520 10/01/2022   Lab Results  Component Value Date   TSH 1.670 09/27/2022       ASSESSMENT AND PLAN  Chronic  nonintractable headache, unspecified headache type  Neck pain  Other fatigue  Pain in other joint   Bilateral splenius capitis, C2-C3, C6-C7 and C7-T1 paraspinal muscle trigger point injection with 6 cc Marcaine containing 80 mg Depo-Medrol using sterile technique.  She tolerated the procedure well and pain was much  better and ROM improved a few minutes afterwards Continue imipramine 25 mg nightly (keep on this dose as also on Prozac).   Maxalt prn migraine in future.     Stay active and exercise.    Gabapentin 300 mg po tid  Return if there are new or worsening neurologic symptoms.   Kamya Watling A. Epimenio Foot, MD, [facility] 10/02/2022, 10:41 AM Certified in Neurology, Clinical Neurophysiology, Sleep Medicine and Neuroimaging  [facility] [address] [place], [place] [address] [phone]

## 2022-10-06 ENCOUNTER — Encounter: Payer: Self-pay | Admitting: Family Medicine

## 2022-10-06 DIAGNOSIS — Z6837 Body mass index (BMI) 37.0-37.9, adult: Secondary | ICD-10-CM | POA: Insufficient documentation

## 2022-10-06 NOTE — Assessment & Plan Note (Signed)
Recommend statin medicine.  Continue to work on eating a healthy diet and exercise.  Labs drawn today.

## 2022-10-13 NOTE — Assessment & Plan Note (Signed)
Recommend continue to work on eating healthy diet and exercise.  Comorbidities: Prediabetes, hyperlipidemia, GAD.

## 2022-10-15 ENCOUNTER — Encounter: Payer: Self-pay | Admitting: Neurology

## 2022-11-11 ENCOUNTER — Other Ambulatory Visit: Payer: Self-pay

## 2022-11-13 ENCOUNTER — Other Ambulatory Visit: Payer: Self-pay

## 2022-12-10 ENCOUNTER — Ambulatory Visit: Payer: No Typology Code available for payment source | Admitting: Nurse Practitioner

## 2022-12-11 ENCOUNTER — Other Ambulatory Visit: Payer: Self-pay | Admitting: Family Medicine

## 2022-12-11 DIAGNOSIS — E559 Vitamin D deficiency, unspecified: Secondary | ICD-10-CM

## 2022-12-30 ENCOUNTER — Other Ambulatory Visit: Payer: Self-pay | Admitting: Family Medicine

## 2022-12-30 ENCOUNTER — Telehealth: Payer: Self-pay

## 2022-12-30 ENCOUNTER — Encounter: Payer: Self-pay | Admitting: Family Medicine

## 2022-12-30 ENCOUNTER — Ambulatory Visit (INDEPENDENT_AMBULATORY_CARE_PROVIDER_SITE_OTHER): Payer: No Typology Code available for payment source | Admitting: Family Medicine

## 2022-12-30 ENCOUNTER — Telehealth: Payer: Self-pay | Admitting: Family Medicine

## 2022-12-30 VITALS — BP 110/70 | HR 88 | Temp 96.6°F | Resp 18 | Ht 63.0 in | Wt 210.2 lb

## 2022-12-30 DIAGNOSIS — R7303 Prediabetes: Secondary | ICD-10-CM | POA: Diagnosis not present

## 2022-12-30 DIAGNOSIS — F411 Generalized anxiety disorder: Secondary | ICD-10-CM | POA: Diagnosis not present

## 2022-12-30 DIAGNOSIS — E782 Mixed hyperlipidemia: Secondary | ICD-10-CM | POA: Diagnosis not present

## 2022-12-30 DIAGNOSIS — Z6837 Body mass index (BMI) 37.0-37.9, adult: Secondary | ICD-10-CM

## 2022-12-30 DIAGNOSIS — R202 Paresthesia of skin: Secondary | ICD-10-CM

## 2022-12-30 DIAGNOSIS — E559 Vitamin D deficiency, unspecified: Secondary | ICD-10-CM

## 2022-12-30 MED ORDER — PHENTERMINE HCL 37.5 MG PO CAPS: 37.5000 mg | ORAL_CAPSULE | ORAL | 2 refills | Status: DC

## 2022-12-30 MED ORDER — ALBUTEROL SULFATE HFA 108 (90 BASE) MCG/ACT IN AERS: 2.0000 | INHALATION_SPRAY | Freq: Four times a day (QID) | RESPIRATORY_TRACT | 2 refills | Status: DC | PRN

## 2022-12-30 NOTE — Assessment & Plan Note (Signed)
Recommend start phentermine 37.5 mg once daily in AM.  Recommend continue to work on eating healthy diet and exercise.  Comorbidities: Prediabetes, hyperlipidemia, GAD.

## 2022-12-30 NOTE — Telephone Encounter (Signed)
Prescription Request  12/30/2022  LOV: 12/30/2022  What is the name of the medication or equipment? albuterol (VENTOLIN HFA) 108 (90 Base) MCG/ACT inhaler [[id number]]  DISCONTINUED   Have you contacted your pharmacy to request a refill?   Which pharmacy would you like this sent to?  [id number] - [place],  - [address] [place] [address] Phone: [phone] Fax: [phone]    Patient notified that their request is being sent to the clinical staff for review and that they should receive a response within 2 business days.   Please advise at Mobile [phone] (mobile)

## 2022-12-30 NOTE — Patient Instructions (Signed)
Start phentermine 37.5 mg daily.

## 2022-12-30 NOTE — Assessment & Plan Note (Signed)
The current medical regimen is effective;  continue present plan and medications.  Continue Prozac 60 mg daily.  Ativan 0.5 mg twice daily Sees psychiatry.

## 2022-12-30 NOTE — Assessment & Plan Note (Addendum)
Check level.  Continue vitamin D 50,000 units weekly

## 2022-12-30 NOTE — Assessment & Plan Note (Signed)
Recommend statin medicine.  Continue to work on eating a healthy diet and exercise.  Labs drawn today.

## 2022-12-30 NOTE — Progress Notes (Signed)
Subjective:  Patient ID: Kimberly Cabrera, female    DOB: 12/15/1975  Age: 47 y.o. MRN: [id number]  Chief Complaint  Patient presents with   Medical Management of Chronic Issues    HPI Hyperlipidemia:  healthy diet and exercise.   GAD:  Prozac 60 mg daily.  Ativan 0.5 mg twice daily.  She is wanting to wean off ativan.     Vitamin D def:  Taking vitamin D 50,000 weekly.  Prediabetes:  Healthy diet and exercise.      12/30/2022    7:30 AM 10/01/2022   11:43 AM 06/07/2022   10:02 AM 03/21/2021    8:59 AM  Depression screen PHQ 2/9  Decreased Interest 0 0 0 0  Down, Depressed, Hopeless 0 0 0 0  PHQ - 2 Score 0 0 0 0  Altered sleeping 1 1    Tired, decreased energy 0 1    Change in appetite 0 0    Feeling bad or failure about yourself  0 0    Trouble concentrating 0 0    Moving slowly or fidgety/restless 0 0    Suicidal thoughts 0 0    PHQ-9 Score 1 2    Difficult doing work/chores Not difficult at all Not difficult at all          12/30/2022    7:30 AM  Fall Risk   Falls in the past year? 0  Number falls in past yr: 0  Injury with Fall? 0  Risk for fall due to : No Fall Risks  Follow up Falls evaluation completed;Falls prevention discussed    Patient Care Team: CoxFritzi Mandes, MD as PCP - General (Family Medicine) Sater, Pearletha Furl, MD (Neurology)   Review of Systems  Constitutional:  Negative for chills, fatigue and fever.  HENT:  Negative for congestion, rhinorrhea and sore throat.   Respiratory:  Negative for cough and shortness of breath.   Cardiovascular:  Negative for chest pain.  Gastrointestinal:  Negative for abdominal pain, constipation, diarrhea, nausea and vomiting.  Genitourinary:  Negative for dysuria and urgency.  Musculoskeletal:  Negative for back pain and myalgias.  Neurological:  Negative for dizziness, weakness, light-headedness and headaches.  Psychiatric/Behavioral:  Negative for dysphoric mood. The patient is not nervous/anxious.     Current  Outpatient Medications on File Prior to Visit  Medication Sig Dispense Refill   FLUoxetine (PROZAC) 20 MG capsule Take 20 mg by mouth daily. Take with Prozac 40 mg to equal 60 mg     FLUoxetine (PROZAC) 40 MG capsule Take 40 mg by mouth See admin instructions. Take with 20 mg for a total of 60 mg daily     gabapentin (NEURONTIN) 300 MG capsule Take 1 capsule (300 mg total) by mouth 3 (three) times daily. 90 capsule 2   imipramine (TOFRANIL) 25 MG tablet Take 2 tablets (50 mg total) by mouth at bedtime. (Patient taking differently: Take 50 mg by mouth at bedtime. 1 Tablet) 60 tablet 11   LORazepam (ATIVAN) 0.5 MG tablet Take 0.5 mg by mouth 2 (two) times daily.     Vitamin D, Ergocalciferol, (DRISDOL) 1.25 MG (50000 UNIT) CAPS capsule TAKE 1 CAPSULE (50,000 UNITS TOTAL) BY MOUTH EVERY 7 (SEVEN) DAYS 12 capsule 1   No current facility-administered medications on file prior to visit.   Past Medical History:  Diagnosis Date   Anxiety    Asthma    seasonal asthma   Depression    no current problem   GERD (  gastroesophageal reflux disease)    occasional - otc med prn   Headache    History of kidney stones    surgery to remove   Hyperlipidemia    no meds, diet controlled   Past Surgical History:  Procedure Laterality Date   abdominal contouring with liposuction and skin resection.     08/2022   ABDOMINAL HYSTERECTOMY     47 yo. Total.   APPENDECTOMY     ECTOPIC PREGNANCY SURGERY N/A    laparoscopic   LIPOSUCTION  09/09/2022   RADIOLOGY WITH ANESTHESIA N/A 07/17/2021   Procedure: MRI WITH ANESTHESIA BRAIN WITH AND WITHOUT CONTRAST, MRI CERVICAL SPINE WITH AND WITHOUT CONTRAST;  Surgeon: Radiologist, Medication, MD;  Location: [facility] OR;  Service: Radiology;  Laterality: N/A;   removal kidney stone     TONSILLECTOMY     WISDOM TOOTH EXTRACTION      Family History  Problem Relation Age of Onset   Hyperlipidemia Mother    Diabetes Mother    Hyperlipidemia Father    Diabetes Father     Heart disease Father    Dementia Father    Melanoma Father    Melanoma Sister    Diabetes Sister    Hypertension Maternal Grandmother    Mental illness Maternal Grandmother    Asthma Paternal Grandfather    Heart attack Paternal Grandfather    Arthritis Paternal Grandfather    Social History   Socioeconomic History   Marital status: Married    Spouse name: Kasidy Pogany   Number of children: Not on file   Years of education: Not on file   Highest education level: Associate degree: occupational, Scientist, product/process development, or vocational program  Occupational History   Occupation: Charity fundraiser    Comment: [employer] ([place]  Tobacco Use   Smoking status: Former    Current packs/day: 0.00    Average packs/day: 0.5 packs/day for 5.0 years (2.5 ttl pk-yrs)    Types: Cigarettes    Start date: 05/07/2016    Quit date: 05/07/2021    Years since quitting: 1.6   Smokeless tobacco: Never   Tobacco comments:    Smokes 2-3 cigarettes a day  Vaping Use   Vaping status: Never Used  Substance and Sexual Activity   Alcohol use: Never   Drug use: Never   Sexual activity: Not Currently    Partners: Male    Birth control/protection: Surgical    Comment: Hysterectomy  Other Topics Concern   Not on file  Social History Narrative   Right Handed   1 Can of Soda per Day   Social Determinants of Health   Financial Resource Strain: Low Risk  (09/27/2022)   Overall Financial Resource Strain (CARDIA)    Difficulty of Paying Living Expenses: Not hard at all  Food Insecurity: No Food Insecurity (09/27/2022)   Hunger Vital Sign    Worried About Running Out of Food in the Last Year: Never true    Ran Out of Food in the Last Year: Never true  Transportation Needs: No Transportation Needs (09/27/2022)   PRAPARE - Administrator, Civil Service (Medical): No    Lack of Transportation (Non-Medical): No  Physical Activity: Insufficiently Active (09/27/2022)   Exercise Vital Sign    Days of Exercise per Week: 3  days    Minutes of Exercise per Session: 20 min  Stress: No Stress Concern Present (09/27/2022)   [employer] of Occupational Health - Occupational Stress Questionnaire    Feeling of Stress :  Only a little  Social Connections: Socially Isolated (10/01/2022)   Social Connection and Isolation Panel [NHANES]    Frequency of Communication with Friends and Family: Twice a week    Frequency of Social Gatherings with Friends and Family: Never    Attends Religious Services: Never    Diplomatic Services operational officer: No    Attends Engineer, structural: Never    Marital Status: Married    Objective:  BP 110/70   Pulse 88   Temp (!) 96.6 F (35.9 C)   Resp 18   Ht 5\' 3"  (1.6 m)   Wt 210 lb 3.2 oz (95.3 kg)   BMI 37.24 kg/m      12/30/2022    7:24 AM 10/02/2022   10:37 AM 10/01/2022   11:39 AM  BP/Weight  Systolic BP 110 124 136  Diastolic BP 70 83 82  Wt. (Lbs) 210.2 212.5 210  BMI 37.24 kg/m2 37.64 kg/m2 37.2 kg/m2    Physical Exam Vitals reviewed.  Constitutional:      Appearance: Normal appearance. She is obese.  Neck:     Vascular: No carotid bruit.  Cardiovascular:     Rate and Rhythm: Normal rate and regular rhythm.     Heart sounds: Normal heart sounds.  Pulmonary:     Effort: Pulmonary effort is normal. No respiratory distress.     Breath sounds: Normal breath sounds.  Abdominal:     General: Abdomen is flat. Bowel sounds are normal.     Palpations: Abdomen is soft.     Tenderness: There is no abdominal tenderness.  Neurological:     Mental Status: She is alert and oriented to person, place, and time.  Psychiatric:        Mood and Affect: Mood normal.        Behavior: Behavior normal.     Diabetic Foot Exam - Simple   No data filed      Lab Results  Component Value Date   WBC 6.7 09/27/2022   HGB 12.5 09/27/2022   HCT 38.8 09/27/2022   PLT 374 09/27/2022   GLUCOSE 129 (H) 09/27/2022   CHOL 228 (H) 09/27/2022   TRIG 176 (H) 09/27/2022    HDL 43 09/27/2022   LDLCALC 153 (H) 09/27/2022   ALT 29 09/27/2022   AST 20 09/27/2022   NA 141 09/27/2022   K 4.6 09/27/2022   CL 102 09/27/2022   CREATININE 0.78 09/27/2022   BUN 13 09/27/2022   CO2 21 09/27/2022   TSH 1.670 09/27/2022   HGBA1C 6.2 (H) 09/27/2022      Assessment & Plan:    Mixed hyperlipidemia Assessment & Plan: Recommend statin medicine.  Continue to work on eating a healthy diet and exercise.  Labs drawn today.    Orders: -     Lipid panel  GAD (generalized anxiety disorder) Assessment & Plan: The current medical regimen is effective;  continue present plan and medications.  Continue Prozac 60 mg daily.  Ativan 0.5 mg twice daily Sees psychiatry.    Prediabetes Assessment & Plan: Continue to work on healthy diet and exercise.    Orders: -     CBC with Differential/Platelet -     CMP14+EGFR -     CBC with Differential/Platelet -     Hemoglobin A1c  Paresthesia -     B12 and Folate Panel -     Methylmalonic acid, serum  Vitamin D deficiency Assessment & Plan: Check level.  Continue  vitamin D 50,000 units weekly  Orders: -     VITAMIN D 25 Hydroxy (Vit-D Deficiency, Fractures)  Class 2 severe obesity due to excess calories with serious comorbidity and body mass index (BMI) of 37.0 to 37.9 in adult [facility]) Assessment & Plan: Recommend start phentermine 37.5 mg once daily in AM.  Recommend continue to work on eating healthy diet and exercise.  Comorbidities: Prediabetes, hyperlipidemia, GAD.   Other orders -     Phentermine HCl; Take 1 capsule (37.5 mg total) by mouth every morning.  Dispense: 30 capsule; Refill: 2     Meds ordered this encounter  Medications   phentermine 37.5 MG capsule    Sig: Take 1 capsule (37.5 mg total) by mouth every morning.    Dispense:  30 capsule    Refill:  2    Orders Placed This Encounter  Procedures   CBC with Differential/Platelet   CMP14+EGFR   Lipid panel   CBC with  Differential/Platelet   Hemoglobin A1c   B12 and Folate Panel   Methylmalonic acid, serum   VITAMIN D 25 Hydroxy (Vit-D Deficiency, Fractures)     Follow-up: Return in about 3 months (around 04/01/2023) for chronic fasting.   I,Carolyn M Morrison,acting as a Neurosurgeon for Blane Ohara, MD.,have documented all relevant documentation on the behalf of Blane Ohara, MD,as directed by  Blane Ohara, MD while in the presence of Blane Ohara, MD.   An After Visit Summary was printed and given to the patient.  Blane Ohara, MD [facility] [phone]

## 2022-12-30 NOTE — Telephone Encounter (Signed)
Sent. Dr. Haeden Hudock  

## 2022-12-30 NOTE — Assessment & Plan Note (Addendum)
>>  ASSESSMENT AND PLAN FOR COMBINED HYPERLIPIDEMIA ASSOCIATED WITH TYPE 2 DIABETES MELLITUS (HCC) WRITTEN ON 12/30/2022  8:44 AM BY Titus Drone, MD  Continue to work on healthy diet and exercise.     >>ASSESSMENT AND PLAN FOR MIXED HYPERLIPIDEMIA WRITTEN ON 12/30/2022  8:42 AM BY Cleta Heatley, MD  Recommend statin medicine.  Continue to work on eating a healthy diet and exercise.  Labs drawn today.

## 2022-12-30 NOTE — Telephone Encounter (Signed)
Patient called requesting a refill for a albuterol inhaler, don't see listed on her medication list. Please advise

## 2023-01-03 ENCOUNTER — Encounter: Payer: Self-pay | Admitting: Family Medicine

## 2023-01-07 ENCOUNTER — Ambulatory Visit: Payer: No Typology Code available for payment source | Admitting: Family Medicine

## 2023-01-07 ENCOUNTER — Other Ambulatory Visit: Payer: Self-pay

## 2023-01-07 DIAGNOSIS — E782 Mixed hyperlipidemia: Secondary | ICD-10-CM

## 2023-01-07 MED ORDER — ROSUVASTATIN CALCIUM 20 MG PO TABS
20.0000 mg | ORAL_TABLET | Freq: Every day | ORAL | 1 refills | Status: DC
Start: 2023-01-07 — End: 2023-04-15

## 2023-01-23 ENCOUNTER — Encounter: Payer: Self-pay | Admitting: Family Medicine

## 2023-02-11 ENCOUNTER — Encounter: Payer: Self-pay | Admitting: Family Medicine

## 2023-02-12 ENCOUNTER — Other Ambulatory Visit: Payer: Self-pay | Admitting: Family Medicine

## 2023-02-12 DIAGNOSIS — Z1231 Encounter for screening mammogram for malignant neoplasm of breast: Secondary | ICD-10-CM

## 2023-03-13 ENCOUNTER — Encounter: Payer: Self-pay | Admitting: Neurology

## 2023-03-14 LAB — HM MAMMOGRAPHY

## 2023-03-18 ENCOUNTER — Encounter: Payer: Self-pay | Admitting: Family Medicine

## 2023-04-15 ENCOUNTER — Ambulatory Visit (INDEPENDENT_AMBULATORY_CARE_PROVIDER_SITE_OTHER): Payer: No Typology Code available for payment source | Admitting: Neurology

## 2023-04-15 ENCOUNTER — Encounter: Payer: Self-pay | Admitting: Neurology

## 2023-04-15 VITALS — BP 134/83 | HR 100 | Ht 64.0 in | Wt 213.5 lb

## 2023-04-15 DIAGNOSIS — M542 Cervicalgia: Secondary | ICD-10-CM

## 2023-04-15 DIAGNOSIS — R519 Headache, unspecified: Secondary | ICD-10-CM | POA: Diagnosis not present

## 2023-04-15 DIAGNOSIS — R5383 Other fatigue: Secondary | ICD-10-CM

## 2023-04-15 DIAGNOSIS — R251 Tremor, unspecified: Secondary | ICD-10-CM

## 2023-04-15 DIAGNOSIS — G8929 Other chronic pain: Secondary | ICD-10-CM

## 2023-04-15 MED ORDER — GABAPENTIN 300 MG PO CAPS: 300.0000 mg | ORAL_CAPSULE | Freq: Three times a day (TID) | ORAL | 6 refills | Status: DC

## 2023-04-15 NOTE — Progress Notes (Signed)
[facility]  PATIENT: Kimberly Cabrera DOB: 1975-12-22  REFERRING DOCTOR OR PCP: Blane Ohara, MD SOURCE: Notes from primary care, rheumatology  _________________________________   HISTORICAL  CHIEF COMPLAINT:  Chief Complaint  Patient presents with   Follow-up    Pt in room 10, husband Thayer Ohm in room. Here for neck pain/ headache follow up. Pt said neck pain still there, left side of head has been numb for 5 days now. Taking gabapentin helps with headaches.Pt would like more injections. Pt c/o tremors in both hand, right worse. Pt said tremors are daily now before it was a couple times per week.    HISTORY OF PRESENT ILLNESS:  Cabrera, Kimberly a 47 y.o. woman with neck pain, headaches and other symptoms.  Update 04/15/2023:  She reports HA and neck pain.  The TPI injections last visit helped.   She is still on gabapentin but just takes whan she has a headache or more neck pain  We discussed trying to take regularly may help more.   Heat, ice, lidocaine ointment, NSAIDs have not helped   HA frequency actually decreased when she stopped Goody Powders.     She has restless legs, worse at night.  Moving around reduces the sensations.   She also notes a tremor in her hands.   It is worse with intention  She is going to be placed on phentermine for weight.   She stopped the imipramine.  It had helped the HA some.    She notes a mild tremor in her hands.   She notes mild cognitive issues, mostly word finding.   She has some dizziness and feels off balanced  Headaches had been doing very well on imipramine but for the last 4 days, she has had a severe migraine with HA, neck pain, Nausea, photophobia/phonophobia.    She took phenergan and Zofran and had no vomiting.      Since last visit, she did have MRI of the brain and cervical spine 07/17/2021.  The brain was normal for age with just 2 punctate T2/FLAIR hyperintense foci.  The cervical spine just showed very minimal  disc bulges.  No spinal stenosis or nerve root compression or significant bony changes   Labs showed mildly elevated ESR but the CRP was normal.  ANA was normal.  History of neurologic symptoms: She has had neurologic symptoms and severe fatigue since a tick bite 09/2019.  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   She did a two week course of doxycycline.   The fatigue never improved.   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    She physically feels poor.      She wakes up a couple times every night.   She snores.  Husband has not noted OSA signs   She has gained 50 pounds in last few years.  She has mild excessive daytime sleepiness.  She has not yet done the home sleep study.  EPWORTH SLEEPINESS SCALE  On a scale of 0 - 3 what is the chance of dozing:  Sitting and Reading:   3 Watching TV:    3 Sitting inactive in a public place: 0 Passenger in car for one hour: 0 Lying down to rest in the afternoon: 3 Sitting and talking to someone: 0 Sitting quietly after lunch:  3 In a car, stopped in traffic:  0  Total (out of 24):  12/24 mild EDS.      Data: 03/21/2021:  B12 was normal.  Vit D was 13.2 (taking 32440 weekly suppl), elevated cholesterol = 273, LDL = 186.   CMP, CBC/D were ok.    05/29/2021: ESR was mildly elevated (39), CRP, ANA were normal.  01/11/2021: CT scan was normal.  MRI of the brain and cervical spine 07/17/2021.  The brain was normal for age with just 2 punctate T2/FLAIR hyperintense foci.  The cervical spine just showed very minimal disc bulges.  No spinal stenosis or nerve root compression or significant bony changes  Vascular risk factors:   She stopped smoking (she was 1ppd x 15).  She has hyperlipidemia and has changed her diet and started Crestor.  She does not have hypertension or diabetes.   REVIEW OF SYSTEMS: Constitutional: No fevers, chills, sweats, or change in appetite Eyes: No visual changes,  double vision, eye pain Ear, nose and throat: No hearing loss, ear pain, nasal congestion, sore throat Cardiovascular: No chest pain, palpitations Respiratory:  No shortness of breath at rest or with exertion.   No wheezes GastrointestinaI: No nausea, vomiting, diarrhea, abdominal pain, fecal incontinence Genitourinary:  No dysuria, urinary retention or frequency.  No nocturia. Musculoskeletal:  No neck pain, back pain Integumentary: No rash, pruritus, skin lesions Neurological: as above Psychiatric: No depression at this time.  No anxiety Endocrine: No palpitations, diaphoresis, change in appetite, change in weigh or increased thirst Hematologic/Lymphatic:  No anemia, purpura, petechiae. Allergic/Immunologic: No itchy/runny eyes, nasal congestion, recent allergic reactions, rashes  ALLERGIES: Allergies  Allergen Reactions   Cefdinir Diarrhea    HOME MEDICATIONS:  Current Outpatient Medications:    albuterol (VENTOLIN HFA) 108 (90 Base) MCG/ACT inhaler, Inhale 2 puffs into the lungs every 6 (six) hours as needed for wheezing or shortness of breath., Disp: 8 g, Rfl: 2   FLUoxetine (PROZAC) 20 MG capsule, Take 20 mg by mouth daily. Take with Prozac 40 mg to equal 60 mg, Disp: , Rfl:    FLUoxetine (PROZAC) 40 MG capsule, Take 40 mg by mouth See admin instructions. Take with 20 mg for a total of 60 mg daily, Disp: , Rfl:    gabapentin (NEURONTIN) 300 MG capsule, Take 1 capsule (300 mg total) by mouth 3 (three) times daily., Disp: 90 capsule, Rfl: 2   LORazepam (ATIVAN) 0.5 MG tablet, Take 0.5 mg by mouth 2 (two) times daily., Disp: , Rfl:    phentermine 37.5 MG capsule, Take 1 capsule (37.5 mg total) by mouth every morning., Disp: 30 capsule, Rfl: 2   rosuvastatin (CRESTOR) 20 MG tablet, Take 1 tablet (20 mg total) by mouth daily., Disp: 90 tablet, Rfl: 1   Vitamin D, Ergocalciferol, (DRISDOL) 1.25 MG (50000 UNIT) CAPS capsule, TAKE 1 CAPSULE (50,000 UNITS TOTAL) BY MOUTH EVERY 7 (SEVEN)  DAYS (Patient not taking: Reported on 04/15/2023), Disp: 12 capsule, Rfl: 1  PAST MEDICAL HISTORY: Past Medical History:  Diagnosis Date   Anxiety    Asthma    seasonal asthma   Depression    no current problem   GERD (gastroesophageal reflux disease)    occasional - otc med prn   Headache    History of kidney stones    surgery to remove   Hyperlipidemia    no meds, diet controlled    PAST SURGICAL HISTORY: Past Surgical History:  Procedure Laterality Date   abdominal contouring with liposuction and skin resection.     08/2022   ABDOMINAL HYSTERECTOMY  47 yo. Total.   APPENDECTOMY     ECTOPIC PREGNANCY SURGERY N/A    laparoscopic   LIPOSUCTION  09/09/2022   RADIOLOGY WITH ANESTHESIA N/A 07/17/2021   Procedure: MRI WITH ANESTHESIA BRAIN WITH AND WITHOUT CONTRAST, MRI CERVICAL SPINE WITH AND WITHOUT CONTRAST;  Surgeon: Radiologist, Medication, MD;  Location: [facility] OR;  Service: Radiology;  Laterality: N/A;   removal kidney stone     TONSILLECTOMY     WISDOM TOOTH EXTRACTION      FAMILY HISTORY: Family History  Problem Relation Age of Onset   Hyperlipidemia Mother    Diabetes Mother    Hyperlipidemia Father    Diabetes Father    Heart disease Father    Dementia Father    Melanoma Father    Melanoma Sister    Diabetes Sister    Hypertension Maternal Grandmother    Mental illness Maternal Grandmother    Asthma Paternal Grandfather    Heart attack Paternal Grandfather    Arthritis Paternal Grandfather     SOCIAL HISTORY:  Social History   Socioeconomic History   Marital status: Married    Spouse name: Maraiya Bernath   Number of children: Not on file   Years of education: Not on file   Highest education level: Associate degree: occupational, Scientist, product/process development, or vocational program  Occupational History   Occupation: Charity fundraiser    Comment: [employer] ([place]  Tobacco Use   Smoking status: Former    Current packs/day: 0.00    Average packs/day: 0.5 packs/day for 5.0  years (2.5 ttl pk-yrs)    Types: Cigarettes    Start date: 05/07/2016    Quit date: 05/07/2021    Years since quitting: 1.9   Smokeless tobacco: Never   Tobacco comments:    Smokes 2-3 cigarettes a day  Vaping Use   Vaping status: Never Used  Substance and Sexual Activity   Alcohol use: Never   Drug use: Never   Sexual activity: Not Currently    Partners: Male    Birth control/protection: Surgical    Comment: Hysterectomy  Other Topics Concern   Not on file  Social History Narrative   Right Handed   1 Can of Soda per Day   Social Determinants of Health   Financial Resource Strain: Low Risk  (09/27/2022)   Overall Financial Resource Strain (CARDIA)    Difficulty of Paying Living Expenses: Not hard at all  Food Insecurity: No Food Insecurity (09/27/2022)   Hunger Vital Sign    Worried About Running Out of Food in the Last Year: Never true    Ran Out of Food in the Last Year: Never true  Transportation Needs: No Transportation Needs (09/27/2022)   PRAPARE - Administrator, Civil Service (Medical): No    Lack of Transportation (Non-Medical): No  Physical Activity: Insufficiently Active (09/27/2022)   Exercise Vital Sign    Days of Exercise per Week: 3 days    Minutes of Exercise per Session: 20 min  Stress: No Stress Concern Present (09/27/2022)   [employer] of Occupational Health - Occupational Stress Questionnaire    Feeling of Stress : Only a little  Social Connections: Socially Isolated (10/01/2022)   Social Connection and Isolation Panel [NHANES]    Frequency of Communication with Friends and Family: Twice a week    Frequency of Social Gatherings with Friends and Family: Never    Attends Religious Services: Never    Database administrator or Organizations: No    Attends  Club or Organization Meetings: Never    Marital Status: Married  Catering manager Violence: Not At Risk (01/04/2022)   Humiliation, Afraid, Rape, and Kick questionnaire    Fear of Current or  Ex-Partner: No    Emotionally Abused: No    Physically Abused: No    Sexually Abused: No     PHYSICAL EXAM  Vitals:   04/15/23 1042 04/15/23 1050  BP: (!) 141/91 134/83  Pulse: 100   Weight: 213 lb 8 oz (96.8 kg)   Height: 5\' 4"  (1.626 m)      Body mass index is 36.65 kg/m.  No results found.     General: The patient is well-developed and well-nourished and in no acute distress  HEENT:  Head is [place]/AT.  Sclera are anicteric.    Neck: She has reduced range of motion in neck (improved after injection).  Left more than right occipital tenderness. .  Also tender over cervical paraspinal muscles.    Skin: Extremities are without rash or  edema.    Neurologic Exam  Mental status: The patient is alert and oriented x 3 at the time of the examination. The patient has apparent normal recent and remote memory, with an apparently normal attention span and concentration ability.   Speech is normal.  Cranial nerves: Extraocular movements are full.  Facial strength and sensation was normal.  No obvious hearing deficits are noted.  Motor:  Very minimal left hand tremor with intention.  Muscle bulk is normal.   Tone is normal. Strength is  5 / 5 in all 4 extremities.   Coordination: Cerebellar testing reveals good finger-nose-finger and heel-to-shin bilaterally.  Gait and station: Station is normal.   Gait is normal. Tandem gait is mildly wide.    Reflexes: Deep tendon reflexes are symmetric and normal, 2 in arms and 3 in legs.       DIAGNOSTIC DATA (LABS, IMAGING, TESTING) - I reviewed patient records, labs, notes, testing and imaging myself where available.  Lab Results  Component Value Date   WBC 6.1 12/30/2022   HGB 14.1 12/30/2022   HCT 43.2 12/30/2022   MCV 84 12/30/2022   PLT 278 12/30/2022      Component Value Date/Time   NA 141 12/30/2022 0750   K 4.5 12/30/2022 0750   CL 102 12/30/2022 0750   CO2 23 12/30/2022 0750   GLUCOSE 131 (H) 12/30/2022 0750   BUN  10 12/30/2022 0750   CREATININE 0.77 12/30/2022 0750   CALCIUM 9.4 12/30/2022 0750   PROT 6.8 12/30/2022 0750   ALBUMIN 4.5 12/30/2022 0750   AST 20 12/30/2022 0750   ALT 26 12/30/2022 0750   ALKPHOS 93 12/30/2022 0750   BILITOT 0.3 12/30/2022 0750   Lab Results  Component Value Date   CHOL 258 (H) 12/30/2022   HDL 42 12/30/2022   LDLCALC 181 (H) 12/30/2022   TRIG 188 (H) 12/30/2022   CHOLHDL 6.1 (H) 12/30/2022   Lab Results  Component Value Date   HGBA1C 6.4 (H) 12/30/2022   Lab Results  Component Value Date   VITAMINB12 451 12/30/2022   Lab Results  Component Value Date   TSH 1.670 09/27/2022       ASSESSMENT AND PLAN  Neck pain  Chronic nonintractable headache, unspecified headache type  Other fatigue  Tremor   Bilateral splenius capitis, splenius cervicus and C7-T1 paraspinal muscle trigger point injection with 6 cc Marcaine containing 80 mg Depo-Medrol using sterile technique.  She tolerated the procedure well and  pain was much  better and ROM improved a few minutes afterwards Continue gabapentin but take regularly (tid)   Maxalt prn migraine in future.     Stay active and exercise.     Tremor is most likely minimal BET and no treatment recommended at this time.  Return 6 months if there are new or worsening neurologic symptoms.   Kalya Troeger A. Epimenio Foot, MD, [facility] 04/15/2023, 11:31 AM Certified in Neurology, Clinical Neurophysiology, Sleep Medicine and Neuroimaging  [facility] [address], [address] [place], [place] [address] [phone]

## 2023-04-27 NOTE — Progress Notes (Unsigned)
Subjective:  Patient ID: Kimberly Cabrera, female    DOB: 01-Oct-1975  Age: 47 y.o. MRN: [id number]  Chief Complaint  Patient presents with   Medical Management of Chronic Issues    HPI History of Present Illness The patient, with a history of occipital neuralgia and a bulging disc in the neck, presents with persistent left facial numbness and tingling. She reports that the symptoms are constant and sometimes associated with headaches. The patient has been receiving trigger point injections into the muscle and nerve, which provide temporary relief. She has also been taking gabapentin three times a day, which she reports helps with the symptoms, but she expresses reluctance to continue due to the number of medications she is currently taking.  The patient also reports a history of prediabetes and high cholesterol. She has been trying to manage these conditions with diet and exercise, but has had difficulty due to the amount of time she spends sitting at a computer for work and school. She has been prescribed rosuvastatin for her high cholesterol, but stopped taking it due to adverse gastrointestinal side effects.  In addition, the patient has a history of generalized anxiety disorder and is currently taking Prozac and lorazepam. She expresses a desire to wean off the lorazepam, but her psychiatrist has advised against it due to current stress levels. The patient also reports a history of asthma, for which she uses an inhaler a couple of times a week.  Assessment & Plan Occipital Neuralgia Persistent left facial numbness and intermittent headaches. Receiving trigger point injections from neurologist. Gabapentin three times daily provides relief. Discontinued Imipramine due to perceived lack of efficacy and potential weight gain. -Continue Gabapentin as prescribed. -Consider ergonomic adjustments to reduce strain from computer use.  Hyperlipidemia Previous intolerance to Rosuvastatin (diarrhea). LDL  last measured at 181. -Check lipid panel. -Consider alternative statin (e.g., Atorvastatin) if LDL remains elevated.  Prediabetes Last HbA1c was 6.4. Patient reports healthy diet and exercise as able. -Check HbA1c. -Continue lifestyle modifications.  Vitamin D deficiency Currently on Vitamin D 50,000 units weekly. -Check Vitamin D level. -Consider increasing dose if level remains low.  Generalized Anxiety Disorder On Prozac 60mg  and Lorazepam 0.5mg  twice daily. Patient expresses desire to wean off Lorazepam but psychiatrist advises against it due to current stress levels. -Continue current regimen.  Seasonal Asthma Occasional use of inhaler. -Refill inhaler prescription. -Consider alternative treatment if usage increases.  General Health Maintenance -Declined flu shot. -Check cholesterol and HbA1c. -Refill Vitamin D prescription if level remains low.   GAD:  Prozac 60 mg daily.  Ativan 0.5 mg twice daily.    Vitamin D def:  Taking vitamin D 50,000 weekly.  Prediabetes:  Healthy diet and exercise.     04/28/2023    7:30 AM 12/30/2022    7:30 AM 10/01/2022   11:43 AM 06/07/2022   10:02 AM 03/21/2021    8:59 AM  Depression screen PHQ 2/9  Decreased Interest 0 0 0 0 0  Down, Depressed, Hopeless 0 0 0 0 0  PHQ - 2 Score 0 0 0 0 0  Altered sleeping 1 1 1     Tired, decreased energy 0 0 1    Change in appetite 0 0 0    Feeling bad or failure about yourself  0 0 0    Trouble concentrating 0 0 0    Moving slowly or fidgety/restless 0 0 0    Suicidal thoughts 0 0 0    PHQ-9 Score 1 1 2  Difficult doing work/chores Not difficult at all Not difficult at all Not difficult at all          04/28/2023    7:30 AM  Fall Risk   Falls in the past year? 0  Number falls in past yr: 0  Injury with Fall? 0  Risk for fall due to : No Fall Risks  Follow up Falls evaluation completed    Patient Care Team: Blane Ohara, MD as PCP - General (Family Medicine) Sater, Pearletha Furl, MD  (Neurology)   Review of Systems  Constitutional:  Negative for chills, fatigue and fever.  HENT:  Negative for congestion, ear pain and sore throat.   Respiratory:  Negative for cough and shortness of breath.   Cardiovascular:  Negative for chest pain.  Gastrointestinal:  Negative for abdominal pain, constipation, diarrhea, nausea and vomiting.  Genitourinary:  Negative for dysuria and urgency.  Musculoskeletal:  Negative for arthralgias and myalgias.  Skin:  Negative for rash.  Neurological:  Positive for headaches. Negative for dizziness.  Psychiatric/Behavioral:  Negative for dysphoric mood. The patient is not nervous/anxious.     Current Outpatient Medications on File Prior to Visit  Medication Sig Dispense Refill   albuterol (VENTOLIN HFA) 108 (90 Base) MCG/ACT inhaler Inhale 2 puffs into the lungs every 6 (six) hours as needed for wheezing or shortness of breath. 8 g 2   FLUoxetine (PROZAC) 20 MG capsule Take 20 mg by mouth daily. Take with Prozac 40 mg to equal 60 mg     FLUoxetine (PROZAC) 40 MG capsule Take 40 mg by mouth See admin instructions. Take with 20 mg for a total of 60 mg daily     gabapentin (NEURONTIN) 300 MG capsule Take 1 capsule (300 mg total) by mouth 3 (three) times daily. 90 capsule 6   LORazepam (ATIVAN) 0.5 MG tablet Take 0.5 mg by mouth 2 (two) times daily.     phentermine 37.5 MG capsule Take 1 capsule (37.5 mg total) by mouth every morning. 30 capsule 2   Vitamin D, Ergocalciferol, (DRISDOL) 1.25 MG (50000 UNIT) CAPS capsule TAKE 1 CAPSULE (50,000 UNITS TOTAL) BY MOUTH EVERY 7 (SEVEN) DAYS (Patient not taking: Reported on 04/15/2023) 12 capsule 1   No current facility-administered medications on file prior to visit.   Past Medical History:  Diagnosis Date   Anxiety    Asthma    seasonal asthma   Depression    no current problem   GERD (gastroesophageal reflux disease)    occasional - otc med prn   Headache    History of kidney stones    surgery to  remove   Hyperlipidemia    no meds, diet controlled   Past Surgical History:  Procedure Laterality Date   abdominal contouring with liposuction and skin resection.     08/2022   ABDOMINAL HYSTERECTOMY     47 yo. Total.   APPENDECTOMY     ECTOPIC PREGNANCY SURGERY N/A    laparoscopic   LIPOSUCTION  09/09/2022   RADIOLOGY WITH ANESTHESIA N/A 07/17/2021   Procedure: MRI WITH ANESTHESIA BRAIN WITH AND WITHOUT CONTRAST, MRI CERVICAL SPINE WITH AND WITHOUT CONTRAST;  Surgeon: Radiologist, Medication, MD;  Location: [facility] OR;  Service: Radiology;  Laterality: N/A;   removal kidney stone     TONSILLECTOMY     WISDOM TOOTH EXTRACTION      Family History  Problem Relation Age of Onset   Hyperlipidemia Mother    Diabetes Mother    Hyperlipidemia Father  Diabetes Father    Heart disease Father    Dementia Father    Melanoma Father    Melanoma Sister    Diabetes Sister    Hypertension Maternal Grandmother    Mental illness Maternal Grandmother    Asthma Paternal Grandfather    Heart attack Paternal Grandfather    Arthritis Paternal Grandfather    Social History   Socioeconomic History   Marital status: Married    Spouse name: Raena Ortlip   Number of children: Not on file   Years of education: Not on file   Highest education level: Associate degree: academic program  Occupational History   Occupation: Charity fundraiser    Comment: [employer] ([place]  Tobacco Use   Smoking status: Former    Current packs/day: 0.00    Average packs/day: 0.5 packs/day for 5.0 years (2.5 ttl pk-yrs)    Types: Cigarettes    Start date: 05/07/2016    Quit date: 05/07/2021    Years since quitting: 1.9   Smokeless tobacco: Never   Tobacco comments:    Smokes 2-3 cigarettes a day  Vaping Use   Vaping status: Never Used  Substance and Sexual Activity   Alcohol use: Never   Drug use: Never   Sexual activity: Not Currently    Partners: Male    Birth control/protection: Surgical    Comment: Hysterectomy   Other Topics Concern   Not on file  Social History Narrative   Right Handed   1 Can of Soda per Day   Social Determinants of Health   Financial Resource Strain: Low Risk  (04/26/2023)   Overall Financial Resource Strain (CARDIA)    Difficulty of Paying Living Expenses: Not hard at all  Food Insecurity: No Food Insecurity (04/26/2023)   Hunger Vital Sign    Worried About Running Out of Food in the Last Year: Never true    Ran Out of Food in the Last Year: Never true  Transportation Needs: No Transportation Needs (04/26/2023)   PRAPARE - Administrator, Civil Service (Medical): No    Lack of Transportation (Non-Medical): No  Physical Activity: Insufficiently Active (04/26/2023)   Exercise Vital Sign    Days of Exercise per Week: 3 days    Minutes of Exercise per Session: 30 min  Stress: Stress Concern Present (04/26/2023)   [employer] of Occupational Health - Occupational Stress Questionnaire    Feeling of Stress : To some extent  Social Connections: Socially Isolated (04/26/2023)   Social Connection and Isolation Panel [NHANES]    Frequency of Communication with Friends and Family: Once a week    Frequency of Social Gatherings with Friends and Family: Once a week    Attends Religious Services: Never    Diplomatic Services operational officer: No    Attends Engineer, structural: Never    Marital Status: Married    Objective:  BP 114/70 (BP Location: Left Arm, Patient Position: Sitting, Cuff Size: Large)   Pulse 87   Temp 97.6 F (36.4 C) (Temporal)   Resp 16   Ht 5\' 4"  (1.626 m)   Wt 211 lb (95.7 kg)   SpO2 98%   BMI 36.22 kg/m      04/28/2023    7:31 AM 04/15/2023   10:50 AM 04/15/2023   10:42 AM  BP/Weight  Systolic BP 114 134 141  Diastolic BP 70 83 91  Wt. (Lbs) 211  213.5  BMI 36.22 kg/m2  36.65 kg/m2  Physical Exam  Diabetic Foot Exam - Simple   No data filed      Lab Results  Component Value Date   WBC 6.1  12/30/2022   HGB 14.1 12/30/2022   HCT 43.2 12/30/2022   PLT 278 12/30/2022   GLUCOSE 131 (H) 12/30/2022   CHOL 258 (H) 12/30/2022   TRIG 188 (H) 12/30/2022   HDL 42 12/30/2022   LDLCALC 181 (H) 12/30/2022   ALT 26 12/30/2022   AST 20 12/30/2022   NA 141 12/30/2022   K 4.5 12/30/2022   CL 102 12/30/2022   CREATININE 0.77 12/30/2022   BUN 10 12/30/2022   CO2 23 12/30/2022   TSH 1.670 09/27/2022   HGBA1C 6.4 (H) 12/30/2022      Assessment & Plan:    Prediabetes  Mixed hyperlipidemia  B12 deficiency   Assessment & Plan Occipital Neuralgia Persistent left facial numbness and intermittent headaches. Receiving trigger point injections from neurologist. Gabapentin three times daily provides relief. Discontinued Imipramine due to perceived lack of efficacy and potential weight gain. -Continue Gabapentin as prescribed. -Consider ergonomic adjustments to reduce strain from computer use.  Hyperlipidemia Previous intolerance to Rosuvastatin (diarrhea). LDL last measured at 181. -Check lipid panel. -Consider alternative statin (e.g., Atorvastatin) if LDL remains elevated.  Prediabetes Last HbA1c was 6.4. Patient reports healthy diet and exercise as able. -Check HbA1c. -Continue lifestyle modifications.  Vitamin D deficiency Currently on Vitamin D 50,000 units weekly. -Check Vitamin D level. -Consider increasing dose if level remains low.  Generalized Anxiety Disorder On Prozac 60mg  and Lorazepam 0.5mg  twice daily. Patient expresses desire to wean off Lorazepam but psychiatrist advises against it due to current stress levels. -Continue current regimen.  Seasonal Asthma Occasional use of inhaler. -Refill inhaler prescription. -Consider alternative treatment if usage increases.  General Health Maintenance -Declined flu shot. -Check cholesterol and HbA1c. -Refill Vitamin D prescription if level remains low.  No orders of the defined types were placed in this  encounter.   No orders of the defined types were placed in this encounter.    Follow-up: No follow-ups on file.   I,Marla I Leal-Borjas,acting as a scribe for Blane Ohara, MD.,have documented all relevant documentation on the behalf of Blane Ohara, MD,as directed by  Blane Ohara, MD while in the presence of Blane Ohara, MD.   An After Visit Summary was printed and given to the patient.  Blane Ohara, MD [facility] [phone]

## 2023-04-28 ENCOUNTER — Ambulatory Visit (INDEPENDENT_AMBULATORY_CARE_PROVIDER_SITE_OTHER): Payer: No Typology Code available for payment source | Admitting: Family Medicine

## 2023-04-28 ENCOUNTER — Encounter: Payer: Self-pay | Admitting: Family Medicine

## 2023-04-28 VITALS — BP 114/70 | HR 87 | Temp 97.6°F | Resp 16 | Ht 64.0 in | Wt 211.0 lb

## 2023-04-28 DIAGNOSIS — E1169 Type 2 diabetes mellitus with other specified complication: Secondary | ICD-10-CM | POA: Diagnosis not present

## 2023-04-28 DIAGNOSIS — F411 Generalized anxiety disorder: Secondary | ICD-10-CM

## 2023-04-28 DIAGNOSIS — E782 Mixed hyperlipidemia: Secondary | ICD-10-CM

## 2023-04-28 DIAGNOSIS — J4520 Mild intermittent asthma, uncomplicated: Secondary | ICD-10-CM

## 2023-04-28 DIAGNOSIS — E559 Vitamin D deficiency, unspecified: Secondary | ICD-10-CM

## 2023-04-28 DIAGNOSIS — R7303 Prediabetes: Secondary | ICD-10-CM

## 2023-04-28 DIAGNOSIS — E66812 Obesity, class 2: Secondary | ICD-10-CM

## 2023-04-28 DIAGNOSIS — E538 Deficiency of other specified B group vitamins: Secondary | ICD-10-CM

## 2023-04-28 DIAGNOSIS — Z6836 Body mass index (BMI) 36.0-36.9, adult: Secondary | ICD-10-CM

## 2023-04-28 DIAGNOSIS — M5481 Occipital neuralgia: Secondary | ICD-10-CM

## 2023-04-28 MED ORDER — ALBUTEROL SULFATE HFA 108 (90 BASE) MCG/ACT IN AERS
2.0000 | INHALATION_SPRAY | Freq: Four times a day (QID) | RESPIRATORY_TRACT | 2 refills | Status: DC | PRN
Start: 2023-04-28 — End: 2023-08-11

## 2023-04-28 NOTE — Patient Instructions (Signed)
VISIT SUMMARY:  During today's visit, we discussed your ongoing issues with left facial numbness and tingling, as well as your history of prediabetes, high cholesterol, generalized anxiety disorder, and asthma. We reviewed your current medications and made some adjustments to better manage your symptoms and overall health.  YOUR PLAN:  -OCCIPITAL NEURALGIA: Occipital neuralgia is a condition where the nerves that run from the top of the spinal cord up through the scalp become inflamed or injured, causing pain. We will continue your current treatment with Gabapentin and suggest ergonomic adjustments to reduce strain from computer use.  -HYPERLIPIDEMIA: Hyperlipidemia means having high levels of fats (lipids) in your blood, which can increase the risk of heart disease. We will check your lipid panel and consider an alternative statin like Atorvastatin if your LDL cholesterol remains elevated.  -PREDIABETES: Prediabetes is a condition where blood sugar levels are higher than normal but not yet high enough to be diagnosed as diabetes. We will check your HbA1c level and encourage you to continue with your healthy diet and exercise.  -VITAMIN D DEFICIENCY: Vitamin D deficiency means you have lower than normal levels of vitamin D, which is important for bone health. We will check your vitamin D level and may increase your dose if it remains low.  -GENERALIZED ANXIETY DISORDER: Generalized anxiety disorder is a condition characterized by excessive, uncontrollable worry about various aspects of life. We will continue your current medications, Prozac and Lorazepam, as advised by your psychiatrist.  -SEASONAL ASTHMA: Seasonal asthma is a type of asthma that is triggered by seasonal allergens. We will refill your inhaler prescription and consider alternative treatments if your usage increases.  INSTRUCTIONS:  Please follow up with the recommended lab tests, including a lipid panel, HbA1c, and vitamin D  level. Continue taking your medications as prescribed and consider making ergonomic adjustments to your workspace. If you experience any new or worsening symptoms, please contact our office.

## 2023-04-29 ENCOUNTER — Encounter: Payer: Self-pay | Admitting: Family Medicine

## 2023-04-29 DIAGNOSIS — E559 Vitamin D deficiency, unspecified: Secondary | ICD-10-CM

## 2023-04-29 DIAGNOSIS — M5481 Occipital neuralgia: Secondary | ICD-10-CM | POA: Insufficient documentation

## 2023-04-29 DIAGNOSIS — J4520 Mild intermittent asthma, uncomplicated: Secondary | ICD-10-CM | POA: Insufficient documentation

## 2023-04-29 LAB — LIPID PANEL
Chol/HDL Ratio: 6.2 {ratio} — ABNORMAL HIGH (ref 0.0–4.4)
Cholesterol, Total: 308 mg/dL — ABNORMAL HIGH (ref 100–199)
HDL: 50 mg/dL (ref >39)
LDL Chol Calc (NIH): 220 mg/dL — ABNORMAL HIGH (ref 0–99)
Triglycerides: 196 mg/dL — ABNORMAL HIGH (ref 0–149)
VLDL Cholesterol Cal: 38 mg/dL (ref 5–40)

## 2023-04-29 LAB — CBC WITH DIFFERENTIAL/PLATELET
Basophils Absolute: 0.1 10*3/uL (ref 0.0–0.2)
Basos: 1 %
EOS (ABSOLUTE): 0.1 10*3/uL (ref 0.0–0.4)
Eos: 1 %
Hematocrit: 46.3 % (ref 34.0–46.6)
Hemoglobin: 15 g/dL (ref 11.1–15.9)
Immature Grans (Abs): 0.1 10*3/uL (ref 0.0–0.1)
Immature Granulocytes: 1 %
Lymphocytes Absolute: 3 10*3/uL (ref 0.7–3.1)
Lymphs: 37 %
MCH: 28.3 pg (ref 26.6–33.0)
MCHC: 32.4 g/dL (ref 31.5–35.7)
MCV: 87 fL (ref 79–97)
Monocytes Absolute: 0.6 10*3/uL (ref 0.1–0.9)
Monocytes: 8 %
Neutrophils Absolute: 4.2 10*3/uL (ref 1.4–7.0)
Neutrophils: 52 %
Platelets: 327 10*3/uL (ref 150–450)
RBC: 5.3 x10E6/uL — ABNORMAL HIGH (ref 3.77–5.28)
RDW: 13.2 % (ref 11.7–15.4)
WBC: 8 10*3/uL (ref 3.4–10.8)

## 2023-04-29 LAB — COMPREHENSIVE METABOLIC PANEL
ALT: 30 [IU]/L (ref 0–32)
AST: 20 [IU]/L (ref 0–40)
Albumin: 4.6 g/dL (ref 3.9–4.9)
Alkaline Phosphatase: 118 [IU]/L (ref 44–121)
BUN/Creatinine Ratio: 14 (ref 9–23)
BUN: 12 mg/dL (ref 6–24)
Bilirubin Total: 0.5 mg/dL (ref 0.0–1.2)
CO2: 23 mmol/L (ref 20–29)
Calcium: 9.5 mg/dL (ref 8.7–10.2)
Chloride: 100 mmol/L (ref 96–106)
Creatinine, Ser: 0.87 mg/dL (ref 0.57–1.00)
Globulin, Total: 2.6 g/dL (ref 1.5–4.5)
Glucose: 128 mg/dL — ABNORMAL HIGH (ref 70–99)
Potassium: 4.9 mmol/L (ref 3.5–5.2)
Sodium: 140 mmol/L (ref 134–144)
Total Protein: 7.2 g/dL (ref 6.0–8.5)
eGFR: 84 mL/min/{1.73_m2} (ref >59)

## 2023-04-29 LAB — HEMOGLOBIN A1C
Est. average glucose Bld gHb Est-mCnc: 157 mg/dL
Hgb A1c MFr Bld: 7.1 % — ABNORMAL HIGH (ref 4.8–5.6)

## 2023-04-29 LAB — VITAMIN D 25 HYDROXY (VIT D DEFICIENCY, FRACTURES): Vit D, 25-Hydroxy: 35.7 ng/mL (ref 30.0–100.0)

## 2023-04-29 NOTE — Assessment & Plan Note (Addendum)
Recommend continue to work on eating healthy diet and exercise. Check lipid level.  Recommend retry crestor with food. If does not tolerate it, I would recommend patient try lipitor.

## 2023-04-29 NOTE — Assessment & Plan Note (Addendum)
>>  ASSESSMENT AND PLAN FOR COMBINED HYPERLIPIDEMIA ASSOCIATED WITH TYPE 2 DIABETES MELLITUS ([facility]) WRITTEN ON 04/29/2023 10:12 PM BY Raymond Azure, MD  HBA1C INCREASED TO 7.1. NEW DIAGNOSIS. Recommend start on mounjaro 2.5 mg weekly x 4 weeks, then increase to 5 mg weekly. -Check HbA1c. -Continue lifestyle modifications.   >>ASSESSMENT AND PLAN FOR MIXED HYPERLIPIDEMIA WRITTEN ON 04/29/2023 10:20 PM BY Gracia Saggese, MD  Recommend continue to work on eating healthy diet and exercise. Check lipid level.  Recommend retry crestor with food. If does not tolerate it, I would recommend patient try lipitor.

## 2023-04-29 NOTE — Assessment & Plan Note (Signed)
Occasional use of inhaler. -Refill inhaler prescription. -Consider alternative treatment if usage increases.

## 2023-04-29 NOTE — Assessment & Plan Note (Signed)
The current medical regimen is effective;  continue present plan and medications.  Continue Prozac 60 mg daily.  Ativan 0.5 mg twice daily Sees psychiatry.

## 2023-04-29 NOTE — Assessment & Plan Note (Signed)
Increase vitamin D twice weekly.

## 2023-04-29 NOTE — Assessment & Plan Note (Signed)
Recommend continue to work on eating healthy diet and exercise. Start mounjaro 2.5 mg weekly.

## 2023-04-29 NOTE — Assessment & Plan Note (Signed)
Persistent left facial numbness and intermittent headaches. Receiving trigger point injections from neurologist. Gabapentin three times daily provides relief. Discontinued Imipramine due to perceived lack of efficacy and potential weight gain. -Continue Gabapentin as prescribed.

## 2023-05-12 ENCOUNTER — Other Ambulatory Visit: Payer: Self-pay | Admitting: Family Medicine

## 2023-05-12 ENCOUNTER — Encounter: Payer: Self-pay | Admitting: Family Medicine

## 2023-05-12 MED ORDER — TIRZEPATIDE 5 MG/0.5ML ~~LOC~~ SOAJ: 5.0000 mg | SUBCUTANEOUS | 0 refills | Status: DC

## 2023-05-12 MED ORDER — VITAMIN D (ERGOCALCIFEROL) 1.25 MG (50000 UNIT) PO CAPS: 50000.0000 [IU] | ORAL_CAPSULE | ORAL | 1 refills | Status: DC

## 2023-07-30 ENCOUNTER — Ambulatory Visit: Payer: No Typology Code available for payment source | Admitting: Family Medicine

## 2023-08-07 NOTE — Progress Notes (Unsigned)
 Subjective:  Patient ID: Kimberly Cabrera, female    DOB: Oct 31, 1975  Age: 48 y.o. MRN: [id number]  Chief Complaint  Patient presents with   Medical Management of Chronic Issues   Discussed the use of AI scribe software for clinical note transcription with the patient, who gave verbal consent to proceed.  History of Present Illness  .hpis     Hyperlipidemia:  healthy diet and exercise.   GAD:  Prozac 60 mg daily.  Ativan 0.5 mg twice daily.  She is wanting to wean off ativan.     Vitamin D def:  Taking vitamin D 50,000 weekly.  Prediabetes:  Healthy diet and exercise.      08/08/2023    7:37 AM 04/28/2023    7:30 AM 12/30/2022    7:30 AM 10/01/2022   11:43 AM 06/07/2022   10:02 AM  Depression screen PHQ 2/9  Decreased Interest 2 0 0 0 0  Down, Depressed, Hopeless 1 0 0 0 0  PHQ - 2 Score 3 0 0 0 0  Altered sleeping 2 1 1 1    Tired, decreased energy 2 0 0 1   Change in appetite 2 0 0 0   Feeling bad or failure about yourself  0 0 0 0   Trouble concentrating 1 0 0 0   Moving slowly or fidgety/restless 0 0 0 0   Suicidal thoughts 0 0 0 0   PHQ-9 Score 10 1 1 2    Difficult doing work/chores Somewhat difficult Not difficult at all Not difficult at all Not difficult at all         08/08/2023    7:37 AM  Fall Risk   Falls in the past year? 0  Number falls in past yr: 0  Injury with Fall? 0  Risk for fall due to : No Fall Risks  Follow up Falls evaluation completed    Patient Care Team: Blane Ohara, MD as PCP - General (Family Medicine) Sater, Pearletha Furl, MD (Neurology)   Review of Systems  Current Outpatient Medications on File Prior to Visit  Medication Sig Dispense Refill   albuterol (VENTOLIN HFA) 108 (90 Base) MCG/ACT inhaler Inhale 2 puffs into the lungs every 6 (six) hours as needed for wheezing or shortness of breath. 8 g 2   FLUoxetine (PROZAC) 20 MG capsule Take 20 mg by mouth daily. Take with Prozac 40 mg to equal 60 mg     FLUoxetine (PROZAC) 40 MG capsule  Take 40 mg by mouth See admin instructions. Take with 20 mg for a total of 60 mg daily     gabapentin (NEURONTIN) 300 MG capsule Take 1 capsule (300 mg total) by mouth 3 (three) times daily. 90 capsule 6   levalbuterol (XOPENEX HFA) 45 MCG/ACT inhaler Inhale 2 puffs into the lungs every 6 (six) hours as needed.     LORazepam (ATIVAN) 0.5 MG tablet Take 0.5 mg by mouth 2 (two) times daily.     Vitamin D, Ergocalciferol, (DRISDOL) 1.25 MG (50000 UNIT) CAPS capsule Take 1 capsule (50,000 Units total) by mouth every 7 (seven) days. 24 capsule 1   No current facility-administered medications on file prior to visit.   Past Medical History:  Diagnosis Date   Anxiety    Asthma    seasonal asthma   Depression    no current problem   GERD (gastroesophageal reflux disease)    occasional - otc med prn   Headache    History of kidney stones  surgery to remove   Hyperlipidemia    no meds, diet controlled   Past Surgical History:  Procedure Laterality Date   abdominal contouring with liposuction and skin resection.     08/2022   ABDOMINAL HYSTERECTOMY     48 yo. Total.   APPENDECTOMY     ECTOPIC PREGNANCY SURGERY N/A    laparoscopic   LIPOSUCTION  09/09/2022   RADIOLOGY WITH ANESTHESIA N/A 07/17/2021   Procedure: MRI WITH ANESTHESIA BRAIN WITH AND WITHOUT CONTRAST, MRI CERVICAL SPINE WITH AND WITHOUT CONTRAST;  Surgeon: Radiologist, Medication, MD;  Location: [facility] OR;  Service: Radiology;  Laterality: N/A;   removal kidney stone     TONSILLECTOMY     WISDOM TOOTH EXTRACTION      Family History  Problem Relation Age of Onset   Hyperlipidemia Mother    Diabetes Mother    Hyperlipidemia Father    Diabetes Father    Heart disease Father    Dementia Father    Melanoma Father    Melanoma Sister    Diabetes Sister    Hypertension Maternal Grandmother    Mental illness Maternal Grandmother    Asthma Paternal Grandfather    Heart attack Paternal Grandfather    Arthritis Paternal  Grandfather    Social History   Socioeconomic History   Marital status: Married    Spouse name: Hensley Aziz   Number of children: Not on file   Years of education: Not on file   Highest education level: Associate degree: academic program  Occupational History   Occupation: Charity fundraiser    Comment: [employer] ([place])  Tobacco Use   Smoking status: Former    Current packs/day: 0.00    Average packs/day: 0.5 packs/day for 5.0 years (2.5 ttl pk-yrs)    Types: Cigarettes    Start date: 05/07/2016    Quit date: 05/07/2021    Years since quitting: 2.2   Smokeless tobacco: Never   Tobacco comments:    Smokes 2-3 cigarettes a day  Vaping Use   Vaping status: Never Used  Substance and Sexual Activity   Alcohol use: Never   Drug use: Never   Sexual activity: Not Currently    Partners: Male    Birth control/protection: Surgical    Comment: Hysterectomy  Other Topics Concern   Not on file  Social History Narrative   Right Handed   1 Can of Soda per Day   Social Drivers of Health   Financial Resource Strain: Low Risk  (08/04/2023)   Overall Financial Resource Strain (CARDIA)    Difficulty of Paying Living Expenses: Not hard at all  Food Insecurity: No Food Insecurity (08/04/2023)   Hunger Vital Sign    Worried About Running Out of Food in the Last Year: Never true    Ran Out of Food in the Last Year: Never true  Transportation Needs: No Transportation Needs (08/04/2023)   PRAPARE - Administrator, Civil Service (Medical): No    Lack of Transportation (Non-Medical): No  Physical Activity: Insufficiently Active (08/04/2023)   Exercise Vital Sign    Days of Exercise per Week: 2 days    Minutes of Exercise per Session: 20 min  Stress: Stress Concern Present (08/04/2023)   [employer] of Occupational Health - Occupational Stress Questionnaire    Feeling of Stress : To some extent  Social Connections: Unknown (08/04/2023)   Social Connection and Isolation Panel [NHANES]     Frequency of Communication with Friends and Family: Patient declined  Frequency of Social Gatherings with Friends and Family: Patient declined    Attends Religious Services: Patient declined    Database administrator or Organizations: No    Attends Engineer, structural: Never    Marital Status: Married    Objective:  BP 108/70 (BP Location: Left Arm, Patient Position: Sitting, Cuff Size: Normal)   Pulse 87   Temp (!) 97.5 F (36.4 C) (Temporal)   Resp 18   Ht 5\' 4"  (1.626 m)   Wt 211 lb 12.8 oz (96.1 kg)   SpO2 97%   BMI 36.36 kg/m      08/08/2023    7:31 AM 04/28/2023    7:31 AM 04/15/2023   10:50 AM  BP/Weight  Systolic BP 108 114 134  Diastolic BP 70 70 83  Wt. (Lbs) 211.8 211   BMI 36.36 kg/m2 36.22 kg/m2     Physical Exam  Diabetic Foot Exam - Simple   No data filed      Lab Results  Component Value Date   WBC 5.2 08/08/2023   HGB 13.8 08/08/2023   HCT 41.9 08/08/2023   PLT 268 08/08/2023   GLUCOSE 125 (H) 08/08/2023   CHOL 292 (H) 08/08/2023   TRIG 340 (H) 08/08/2023   HDL 39 (L) 08/08/2023   LDLCALC 186 (H) 08/08/2023   ALT 26 08/08/2023   AST 20 08/08/2023   NA 142 08/08/2023   K 4.4 08/08/2023   CL 104 08/08/2023   CREATININE 0.70 08/08/2023   BUN 6 08/08/2023   CO2 21 08/08/2023   TSH 1.670 09/27/2022   HGBA1C 6.5 (H) 08/08/2023      Assessment & Plan:  Assessment and Plan       Combined hyperlipidemia associated with type 2 diabetes mellitus (HCC) Assessment & Plan: Start Ozempic 0.25 mg weekly.  After 4 weeks increase to 0.5 mg weekly.  Please call and let us know that you are tolerating this and we will adjust dose monthly.   Orders: -     Hemoglobin A1c -     Lipid panel -     Microalbumin / creatinine urine ratio -     EKG 12-Lead  Mixed hyperlipidemia Assessment & Plan: Recommend start atorvastatin 10 mg nightly.  Orders: -     CBC with Differential/Platelet -     Comprehensive metabolic  panel  Bilateral occipital neuralgia Assessment & Plan: Persistent left facial numbness and intermittent headaches. Receiving trigger point injections from neurologist. Gabapentin three times daily provides relief. Discontinued Imipramine due to perceived lack of efficacy and potential weight gain. -Continue Gabapentin as prescribed.   Familial hyperlipidemia Assessment & Plan: Recommend start atorvastatin 10 mg nightly. Recommend baby aspirin 81 mg once daily.    GAD (generalized anxiety disorder) Assessment & Plan: The current medical regimen is effective;  continue present plan and medications.  Continue Prozac 60 mg daily.  Ativan 0.5 mg twice daily Sees psychiatry.    Other chest pain Assessment & Plan: Recommend baby aspirin 81 mg once daily.  Prescription for nitroglycerin given.  Recommended patient try to check her blood pressure prior to taking nitroglycerin, but patient is to take 1 at onset of chest pain if systolic blood pressure is above 100 and repeat in 5 minutes if not resolved.  Sending to the hospital for troponin.  Based on these results we will either send to the hospital if elevated or Refer to cardiology.  Order EKG.  Orders: -     Aspirin;  Take 1 tablet (81 mg total) by mouth daily. Swallow whole.  Other orders -     Nitroglycerin; Place 1 tablet (0.4 mg total) under the tongue every 5 (five) minutes as needed for chest pain.  Dispense: 25 tablet; Refill: 0 -     Atorvastatin Calcium; Take 1 tablet (10 mg total) by mouth daily.  Dispense: 90 tablet; Refill: 0 -     Ozempic (0.25 or 0.5 MG/DOSE); Inject 0.25 mg into the skin once a week.  Dispense: 3 mL; Refill: 0     Meds ordered this encounter  Medications   DISCONTD: Semaglutide,0.25 or 0.5MG /DOS, (OZEMPIC, 0.25 OR 0.5 MG/DOSE,) 2 MG/3ML SOPN    Sig: Inject 0.25 mg into the skin once a week.    Dispense:  3 mL    Refill:  0   nitroGLYCERIN (NITROSTAT) 0.4 MG SL tablet    Sig: Place 1 tablet  (0.4 mg total) under the tongue every 5 (five) minutes as needed for chest pain.    Dispense:  25 tablet    Refill:  0   atorvastatin (LIPITOR) 10 MG tablet    Sig: Take 1 tablet (10 mg total) by mouth daily.    Dispense:  90 tablet    Refill:  0   aspirin EC 81 MG tablet    Sig: Take 1 tablet (81 mg total) by mouth daily. Swallow whole.   Semaglutide,0.25 or 0.5MG /DOS, (OZEMPIC, 0.25 OR 0.5 MG/DOSE,) 2 MG/3ML SOPN    Sig: Inject 0.25 mg into the skin once a week.    Dispense:  3 mL    Refill:  0    Orders Placed This Encounter  Procedures   CBC with Differential/Platelet   Comprehensive metabolic panel   Hemoglobin A1c   Lipid panel   Microalbumin / creatinine urine ratio   EKG 12-Lead     Follow-up: Return in about 6 weeks (around 09/19/2023) for chronic follow up (also needs a 3 month follow up. ).   I,Marla I Leal-Borjas,acting as a scribe for Blane Ohara, MD.,have documented all relevant documentation on the behalf of Blane Ohara, MD,as directed by  Blane Ohara, MD while in the presence of Blane Ohara, MD.   An After Visit Summary was printed and given to the patient.  Blane Ohara, MD [facility] [phone]

## 2023-08-08 ENCOUNTER — Encounter: Payer: Self-pay | Admitting: Family Medicine

## 2023-08-08 ENCOUNTER — Ambulatory Visit (INDEPENDENT_AMBULATORY_CARE_PROVIDER_SITE_OTHER): Admitting: Family Medicine

## 2023-08-08 VITALS — BP 108/70 | HR 87 | Temp 97.5°F | Resp 18 | Ht 64.0 in | Wt 211.8 lb

## 2023-08-08 DIAGNOSIS — J4520 Mild intermittent asthma, uncomplicated: Secondary | ICD-10-CM

## 2023-08-08 DIAGNOSIS — M5481 Occipital neuralgia: Secondary | ICD-10-CM | POA: Diagnosis not present

## 2023-08-08 DIAGNOSIS — F411 Generalized anxiety disorder: Secondary | ICD-10-CM

## 2023-08-08 DIAGNOSIS — E782 Mixed hyperlipidemia: Secondary | ICD-10-CM | POA: Diagnosis not present

## 2023-08-08 DIAGNOSIS — E7849 Other hyperlipidemia: Secondary | ICD-10-CM

## 2023-08-08 DIAGNOSIS — E1169 Type 2 diabetes mellitus with other specified complication: Secondary | ICD-10-CM

## 2023-08-08 DIAGNOSIS — Z6836 Body mass index (BMI) 36.0-36.9, adult: Secondary | ICD-10-CM

## 2023-08-08 DIAGNOSIS — R0789 Other chest pain: Secondary | ICD-10-CM

## 2023-08-08 DIAGNOSIS — E1165 Type 2 diabetes mellitus with hyperglycemia: Secondary | ICD-10-CM

## 2023-08-08 MED ORDER — OZEMPIC (0.25 OR 0.5 MG/DOSE) 2 MG/3ML ~~LOC~~ SOPN: 0.2500 mg | PEN_INJECTOR | SUBCUTANEOUS | 0 refills | Status: DC

## 2023-08-08 MED ORDER — ATORVASTATIN CALCIUM 10 MG PO TABS: 10.0000 mg | ORAL_TABLET | Freq: Every day | ORAL | 0 refills | Status: DC

## 2023-08-08 MED ORDER — NITROGLYCERIN 0.4 MG SL SUBL: 0.4000 mg | SUBLINGUAL_TABLET | SUBLINGUAL | 0 refills | Status: AC | PRN

## 2023-08-08 MED ORDER — ASPIRIN 81 MG PO TBEC: 81.0000 mg | DELAYED_RELEASE_TABLET | Freq: Every day | ORAL | Status: AC

## 2023-08-08 NOTE — Patient Instructions (Addendum)
 Recommend start atorvastatin 10 mg nightly. Recommend baby aspirin 81 mg once daily. Prescription for nitroglycerin given.  Recommended patient try to check her blood pressure prior to taking nitroglycerin, but patient is to take 1 at onset of chest pain if systolic blood pressure is above 100 and repeat in 5 minutes if not resolved. Start Ozempic 0.25 mg weekly.  After 4 weeks increase to 0.5 mg weekly.  Please call and let us know that you are tolerating this and we will adjust dose monthly. Sending to the hospital for troponin.  Based on these results we will either send to the hospital if elevated or Refer to cardiology.

## 2023-08-09 ENCOUNTER — Other Ambulatory Visit: Payer: Self-pay | Admitting: Family Medicine

## 2023-08-09 DIAGNOSIS — J4520 Mild intermittent asthma, uncomplicated: Secondary | ICD-10-CM

## 2023-08-09 LAB — CBC WITH DIFFERENTIAL/PLATELET
Basophils Absolute: 0 10*3/uL (ref 0.0–0.2)
Basos: 1 %
EOS (ABSOLUTE): 0.2 10*3/uL (ref 0.0–0.4)
Eos: 4 %
Hematocrit: 41.9 % (ref 34.0–46.6)
Hemoglobin: 13.8 g/dL (ref 11.1–15.9)
Immature Grans (Abs): 0 10*3/uL (ref 0.0–0.1)
Immature Granulocytes: 0 %
Lymphocytes Absolute: 2.4 10*3/uL (ref 0.7–3.1)
Lymphs: 47 %
MCH: 28.8 pg (ref 26.6–33.0)
MCHC: 32.9 g/dL (ref 31.5–35.7)
MCV: 88 fL (ref 79–97)
Monocytes Absolute: 0.4 10*3/uL (ref 0.1–0.9)
Monocytes: 8 %
Neutrophils Absolute: 2.1 10*3/uL (ref 1.4–7.0)
Neutrophils: 40 %
Platelets: 268 10*3/uL (ref 150–450)
RBC: 4.79 x10E6/uL (ref 3.77–5.28)
RDW: 13.4 % (ref 11.7–15.4)
WBC: 5.2 10*3/uL (ref 3.4–10.8)

## 2023-08-09 LAB — COMPREHENSIVE METABOLIC PANEL
ALT: 26 IU/L (ref 0–32)
AST: 20 IU/L (ref 0–40)
Albumin: 4.4 g/dL (ref 3.9–4.9)
Alkaline Phosphatase: 107 IU/L (ref 44–121)
BUN/Creatinine Ratio: 9 (ref 9–23)
BUN: 6 mg/dL (ref 6–24)
Bilirubin Total: 0.2 mg/dL (ref 0.0–1.2)
CO2: 21 mmol/L (ref 20–29)
Calcium: 9 mg/dL (ref 8.7–10.2)
Chloride: 104 mmol/L (ref 96–106)
Creatinine, Ser: 0.7 mg/dL (ref 0.57–1.00)
Globulin, Total: 2.2 g/dL (ref 1.5–4.5)
Glucose: 125 mg/dL — ABNORMAL HIGH (ref 70–99)
Potassium: 4.4 mmol/L (ref 3.5–5.2)
Sodium: 142 mmol/L (ref 134–144)
Total Protein: 6.6 g/dL (ref 6.0–8.5)
eGFR: 108 mL/min/{1.73_m2} (ref >59)

## 2023-08-09 LAB — LIPID PANEL
Chol/HDL Ratio: 7.5 ratio — ABNORMAL HIGH (ref 0.0–4.4)
Cholesterol, Total: 292 mg/dL — ABNORMAL HIGH (ref 100–199)
HDL: 39 mg/dL — ABNORMAL LOW (ref >39)
LDL Chol Calc (NIH): 186 mg/dL — ABNORMAL HIGH (ref 0–99)
Triglycerides: 340 mg/dL — ABNORMAL HIGH (ref 0–149)
VLDL Cholesterol Cal: 67 mg/dL — ABNORMAL HIGH (ref 5–40)

## 2023-08-09 LAB — HEMOGLOBIN A1C
Est. average glucose Bld gHb Est-mCnc: 140 mg/dL
Hgb A1c MFr Bld: 6.5 % — ABNORMAL HIGH (ref 4.8–5.6)

## 2023-08-09 LAB — MICROALBUMIN / CREATININE URINE RATIO
Creatinine, Urine: 111.4 mg/dL
Microalb/Creat Ratio: 5 mg/g{creat} (ref 0–29)
Microalbumin, Urine: 5.1 ug/mL

## 2023-08-10 ENCOUNTER — Encounter: Payer: Self-pay | Admitting: Family Medicine

## 2023-08-10 DIAGNOSIS — E782 Mixed hyperlipidemia: Secondary | ICD-10-CM | POA: Insufficient documentation

## 2023-08-10 DIAGNOSIS — R0789 Other chest pain: Secondary | ICD-10-CM | POA: Insufficient documentation

## 2023-08-10 NOTE — Assessment & Plan Note (Signed)
 Recommend start atorvastatin 10 mg nightly. Recommend baby aspirin 81 mg once daily.

## 2023-08-10 NOTE — Assessment & Plan Note (Signed)
 Persistent left facial numbness and intermittent headaches. Receiving trigger point injections from neurologist. Gabapentin three times daily provides relief. Discontinued Imipramine due to perceived lack of efficacy and potential weight gain. -Continue Gabapentin as prescribed.

## 2023-08-10 NOTE — Assessment & Plan Note (Signed)
 Start Ozempic 0.25 mg weekly.  After 4 weeks increase to 0.5 mg weekly.  Please call and let us know that you are tolerating this and we will adjust dose monthly.

## 2023-08-10 NOTE — Assessment & Plan Note (Signed)
 Recommend baby aspirin 81 mg once daily.  Prescription for nitroglycerin given.  Recommended patient try to check her blood pressure prior to taking nitroglycerin, but patient is to take 1 at onset of chest pain if systolic blood pressure is above 100 and repeat in 5 minutes if not resolved.  Sending to the hospital for troponin.  Based on these results we will either send to the hospital if elevated or Refer to cardiology.  Order EKG.

## 2023-08-10 NOTE — Assessment & Plan Note (Signed)
The current medical regimen is effective;  continue present plan and medications.  Continue Prozac 60 mg daily.  Ativan 0.5 mg twice daily Sees psychiatry.

## 2023-08-10 NOTE — Assessment & Plan Note (Signed)
 Recommend start atorvastatin 10 mg nightly.

## 2023-08-12 ENCOUNTER — Other Ambulatory Visit: Payer: Self-pay

## 2023-08-15 NOTE — Assessment & Plan Note (Signed)
 Increased albuterol use indicates suboptimal asthma control. Recent bronchitis exacerbated symptoms. - Consider adding a maintenance inhaler for better asthma control.

## 2023-08-15 NOTE — Assessment & Plan Note (Signed)
 Comorbidities: diabetes. Recommend continue to work on eating healthy diet and exercise. Start ozempic.

## 2023-09-07 ENCOUNTER — Other Ambulatory Visit: Payer: Self-pay | Admitting: Family Medicine

## 2023-09-07 DIAGNOSIS — E7849 Other hyperlipidemia: Secondary | ICD-10-CM

## 2023-09-07 MED ORDER — ATORVASTATIN CALCIUM 10 MG PO TABS: 10.0000 mg | ORAL_TABLET | Freq: Every day | ORAL | 0 refills | Status: DC

## 2023-09-21 NOTE — Progress Notes (Unsigned)
 Subjective:  Patient ID: Kimberly Cabrera, female    DOB: 17-Jan-1976  Age: 48 y.o. MRN: [id number]  No chief complaint on file.   HPI:     08/08/2023    7:37 AM 04/28/2023    7:30 AM 12/30/2022    7:30 AM 10/01/2022   11:43 AM 06/07/2022   10:02 AM  Depression screen PHQ 2/9  Decreased Interest 2 0 0 0 0  Down, Depressed, Hopeless 1 0 0 0 0  PHQ - 2 Score 3 0 0 0 0  Altered sleeping 2 1 1 1    Tired, decreased energy 2 0 0 1   Change in appetite 2 0 0 0   Feeling bad or failure about yourself  0 0 0 0   Trouble concentrating 1 0 0 0   Moving slowly or fidgety/restless 0 0 0 0   Suicidal thoughts 0 0 0 0   PHQ-9 Score 10 1 1 2    Difficult doing work/chores Somewhat difficult Not difficult at all Not difficult at all Not difficult at all         08/08/2023    7:37 AM  Fall Risk   Falls in the past year? 0  Number falls in past yr: 0  Injury with Fall? 0  Risk for fall due to : No Fall Risks  Follow up Falls evaluation completed    Patient Care Team: Chapman Matteucci, Burleigh Carp, MD as PCP - General (Family Medicine) Sater, Sherida Dimmer, MD (Neurology)   Review of Systems  Current Outpatient Medications on File Prior to Visit  Medication Sig Dispense Refill   albuterol  (VENTOLIN  HFA) 108 (90 Base) MCG/ACT inhaler TAKE 2 PUFFS BY MOUTH EVERY 6 HOURS AS NEEDED FOR WHEEZE OR SHORTNESS OF BREATH 6.7 each 2   aspirin  EC 81 MG tablet Take 1 tablet (81 mg total) by mouth daily. Swallow whole.     atorvastatin  (LIPITOR) 10 MG tablet Take 1 tablet (10 mg total) by mouth daily. 90 tablet 0   FLUoxetine (PROZAC) 20 MG capsule Take 20 mg by mouth daily. Take with Prozac 40 mg to equal 60 mg     FLUoxetine (PROZAC) 40 MG capsule Take 40 mg by mouth See admin instructions. Take with 20 mg for a total of 60 mg daily     gabapentin  (NEURONTIN ) 300 MG capsule Take 1 capsule (300 mg total) by mouth 3 (three) times daily. 90 capsule 6   levalbuterol (XOPENEX HFA) 45 MCG/ACT inhaler Inhale 2 puffs into the lungs  every 6 (six) hours as needed.     LORazepam (ATIVAN) 0.5 MG tablet Take 0.5 mg by mouth 2 (two) times daily.     nitroGLYCERIN  (NITROSTAT ) 0.4 MG SL tablet Place 1 tablet (0.4 mg total) under the tongue every 5 (five) minutes as needed for chest pain. 25 tablet 0   Semaglutide ,0.25 or 0.5MG /DOS, (OZEMPIC , 0.25 OR 0.5 MG/DOSE,) 2 MG/3ML SOPN Inject 0.25 mg into the skin once a week. 3 mL 0   Vitamin D , Ergocalciferol , (DRISDOL ) 1.25 MG (50000 UNIT) CAPS capsule Take 1 capsule (50,000 Units total) by mouth every 7 (seven) days. 24 capsule 1   No current facility-administered medications on file prior to visit.   Past Medical History:  Diagnosis Date   Anxiety    Asthma    seasonal asthma   Depression    no current problem   GERD (gastroesophageal reflux disease)    occasional - otc med prn   Headache    History  of kidney stones    surgery to remove   Hyperlipidemia    no meds, diet controlled   Past Surgical History:  Procedure Laterality Date   abdominal contouring with liposuction and skin resection.     08/2022   ABDOMINAL HYSTERECTOMY     48 yo. Total.   APPENDECTOMY     ECTOPIC PREGNANCY SURGERY N/A    laparoscopic   LIPOSUCTION  09/09/2022   RADIOLOGY WITH ANESTHESIA N/A 07/17/2021   Procedure: MRI WITH ANESTHESIA BRAIN WITH AND WITHOUT CONTRAST, MRI CERVICAL SPINE WITH AND WITHOUT CONTRAST;  Surgeon: Radiologist, Medication, MD;  Location: [facility] OR;  Service: Radiology;  Laterality: N/A;   removal kidney stone     TONSILLECTOMY     WISDOM TOOTH EXTRACTION      Family History  Problem Relation Age of Onset   Hyperlipidemia Mother    Diabetes Mother    Hyperlipidemia Father    Diabetes Father    Heart disease Father    Dementia Father    Melanoma Father    Melanoma Sister    Diabetes Sister    Hypertension Maternal Grandmother    Mental illness Maternal Grandmother    Asthma Paternal Grandfather    Heart attack Paternal Grandfather    Arthritis Paternal  Grandfather    Social History   Socioeconomic History   Marital status: Married    Spouse name: Twinkle Oppermann   Number of children: Not on file   Years of education: Not on file   Highest education level: Associate degree: academic program  Occupational History   Occupation: Charity fundraiser    Comment: [employer] ([place])  Tobacco Use   Smoking status: Former    Current packs/day: 0.00    Average packs/day: 0.5 packs/day for 5.0 years (2.5 ttl pk-yrs)    Types: Cigarettes    Start date: 05/07/2016    Quit date: 05/07/2021    Years since quitting: 2.3   Smokeless tobacco: Never   Tobacco comments:    Smokes 2-3 cigarettes a day  Vaping Use   Vaping status: Never Used  Substance and Sexual Activity   Alcohol use: Never   Drug use: Never   Sexual activity: Not Currently    Partners: Male    Birth control/protection: Surgical    Comment: Hysterectomy  Other Topics Concern   Not on file  Social History Narrative   Right Handed   1 Can of Soda per Day   Social Drivers of Health   Financial Resource Strain: Low Risk  (08/04/2023)   Overall Financial Resource Strain (CARDIA)    Difficulty of Paying Living Expenses: Not hard at all  Food Insecurity: No Food Insecurity (08/04/2023)   Hunger Vital Sign    Worried About Running Out of Food in the Last Year: Never true    Ran Out of Food in the Last Year: Never true  Transportation Needs: No Transportation Needs (08/04/2023)   PRAPARE - Administrator, Civil Service (Medical): No    Lack of Transportation (Non-Medical): No  Physical Activity: Insufficiently Active (08/04/2023)   Exercise Vital Sign    Days of Exercise per Week: 2 days    Minutes of Exercise per Session: 20 min  Stress: Stress Concern Present (08/04/2023)   [employer] of Occupational Health - Occupational Stress Questionnaire    Feeling of Stress : To some extent  Social Connections: Unknown (08/04/2023)   Social Connection and Isolation Panel [NHANES]     Frequency of Communication  with Friends and Family: Patient declined    Frequency of Social Gatherings with Friends and Family: Patient declined    Attends Religious Services: Patient declined    Database administrator or Organizations: No    Attends Banker Meetings: Never    Marital Status: Married    Objective:  There were no vitals taken for this visit.     08/08/2023    7:31 AM 04/28/2023    7:31 AM 04/15/2023   10:50 AM  BP/Weight  Systolic BP 108 114 134  Diastolic BP 70 70 83  Wt. (Lbs) 211.8 211   BMI 36.36 kg/m2 36.22 kg/m2     Physical Exam  Diabetic Foot Exam - Simple   No data filed      Lab Results  Component Value Date   WBC 5.2 08/08/2023   HGB 13.8 08/08/2023   HCT 41.9 08/08/2023   PLT 268 08/08/2023   GLUCOSE 125 (H) 08/08/2023   CHOL 292 (H) 08/08/2023   TRIG 340 (H) 08/08/2023   HDL 39 (L) 08/08/2023   LDLCALC 186 (H) 08/08/2023   ALT 26 08/08/2023   AST 20 08/08/2023   NA 142 08/08/2023   K 4.4 08/08/2023   CL 104 08/08/2023   CREATININE 0.70 08/08/2023   BUN 6 08/08/2023   CO2 21 08/08/2023   TSH 1.670 09/27/2022   HGBA1C 6.5 (H) 08/08/2023      Assessment & Plan:  There are no diagnoses linked to this encounter.   No orders of the defined types were placed in this encounter.   No orders of the defined types were placed in this encounter.    Follow-up: No follow-ups on file.   I,Marla I Leal-Borjas,acting as a scribe for Mercy Stall, MD.,have documented all relevant documentation on the behalf of Mercy Stall, MD,as directed by  Mercy Stall, MD while in the presence of Mercy Stall, MD.   An After Visit Summary was printed and given to the patient.  Mercy Stall, MD [facility] [phone]

## 2023-09-22 ENCOUNTER — Encounter: Payer: Self-pay | Admitting: Family Medicine

## 2023-09-22 ENCOUNTER — Ambulatory Visit (INDEPENDENT_AMBULATORY_CARE_PROVIDER_SITE_OTHER): Admitting: Family Medicine

## 2023-09-22 DIAGNOSIS — E1165 Type 2 diabetes mellitus with hyperglycemia: Secondary | ICD-10-CM | POA: Diagnosis not present

## 2023-09-22 MED ORDER — OZEMPIC (0.25 OR 0.5 MG/DOSE) 2 MG/3ML ~~LOC~~ SOPN: 0.2500 mg | PEN_INJECTOR | SUBCUTANEOUS | 0 refills | Status: DC

## 2023-09-24 NOTE — Assessment & Plan Note (Signed)
Sent Ozempic

## 2023-11-04 ENCOUNTER — Ambulatory Visit: Payer: No Typology Code available for payment source | Admitting: Neurology

## 2023-11-12 NOTE — Progress Notes (Unsigned)
 Subjective:  Patient ID: Kimberly Cabrera, female    DOB: 05-12-1976  Age: 48 y.o. MRN: [id number]  No chief complaint on file.   HPI: GAD:  Prozac 60 mg daily.  Ativan 0.5 mg twice daily.     Vitamin D  def:  Taking vitamin D  50,000 weekly.  Diabetes:  Healthy diet and exercise. I sent ozempic , but was $384 per month. Maintaining her weight, but no loss.   Hyperlipidemia: Atorvastatin  40 mg before bed.   CORONARY ARTERY DISEASE: 40% blockage. Patient saw [facility], Dr. Gwenn Devonshire. She increased her Atorvastatin  40 mg before bed. Baby aspirin  daily.      11/14/2023    7:45 AM 08/08/2023    7:37 AM 04/28/2023    7:30 AM 12/30/2022    7:30 AM 10/01/2022   11:43 AM  Depression screen PHQ 2/9  Decreased Interest 0 2 0 0 0  Down, Depressed, Hopeless 0 1 0 0 0  PHQ - 2 Score 0 3 0 0 0  Altered sleeping 1 2 1 1 1   Tired, decreased energy 1 2 0 0 1  Change in appetite 1 2 0 0 0  Feeling bad or failure about yourself  0 0 0 0 0  Trouble concentrating 0 1 0 0 0  Moving slowly or fidgety/restless 0 0 0 0 0  Suicidal thoughts 0 0 0 0 0  PHQ-9 Score 3 10 1 1 2   Difficult doing work/chores Not difficult at all Somewhat difficult Not difficult at all Not difficult at all Not difficult at all        08/08/2023    7:37 AM  Fall Risk   Falls in the past year? 0  Number falls in past yr: 0  Injury with Fall? 0  Risk for fall due to : No Fall Risks  Follow up Falls evaluation completed    Patient Care Team: Sherre Clapper, MD as PCP - General (Family Medicine) Sater, Charlie LABOR, MD (Neurology)   Review of Systems  Constitutional:  Negative for chills, fatigue and fever.  HENT:  Negative for congestion, ear pain, postnasal drip, rhinorrhea, sinus pressure, sinus pain and sore throat.   Respiratory:  Negative for cough and shortness of breath.   Cardiovascular:  Negative for chest pain.  Gastrointestinal:  Negative for abdominal pain, constipation, diarrhea, nausea and vomiting.   Genitourinary:  Negative for dysuria and urgency.  Musculoskeletal:  Negative for back pain and myalgias.  Neurological:  Negative for dizziness, weakness, light-headedness and headaches.  Psychiatric/Behavioral:  Negative for dysphoric mood. The patient is not nervous/anxious.     Current Outpatient Medications on File Prior to Visit  Medication Sig Dispense Refill   atorvastatin  (LIPITOR) 40 MG tablet Take 40 mg by mouth daily.     aspirin  EC 81 MG tablet Take 1 tablet (81 mg total) by mouth daily. Swallow whole.     FLUoxetine (PROZAC) 20 MG capsule Take 20 mg by mouth daily. Take with Prozac 40 mg to equal 60 mg     FLUoxetine (PROZAC) 40 MG capsule Take 40 mg by mouth See admin instructions. Take with 20 mg for a total of 60 mg daily     LORazepam (ATIVAN) 0.5 MG tablet Take 0.5 mg by mouth 2 (two) times daily.     nitroGLYCERIN  (NITROSTAT ) 0.4 MG SL tablet Place 1 tablet (0.4 mg total) under the tongue every 5 (five) minutes as needed for chest pain. 25 tablet 0   No current facility-administered medications  on file prior to visit.   Past Medical History:  Diagnosis Date   Anxiety    Asthma    seasonal asthma   Depression    no current problem   GERD (gastroesophageal reflux disease)    occasional - otc med prn   Headache    History of kidney stones    surgery to remove   Hyperlipidemia    no meds, diet controlled   Past Surgical History:  Procedure Laterality Date   abdominal contouring with liposuction and skin resection.     08/2022   ABDOMINAL HYSTERECTOMY     48 yo. Total.   APPENDECTOMY     ECTOPIC PREGNANCY SURGERY N/A    laparoscopic   LIPOSUCTION  09/09/2022   RADIOLOGY WITH ANESTHESIA N/A 07/17/2021   Procedure: MRI WITH ANESTHESIA BRAIN WITH AND WITHOUT CONTRAST, MRI CERVICAL SPINE WITH AND WITHOUT CONTRAST;  Surgeon: Radiologist, Medication, MD;  Location: [facility] OR;  Service: Radiology;  Laterality: N/A;   removal kidney stone     TONSILLECTOMY      WISDOM TOOTH EXTRACTION      Family History  Problem Relation Age of Onset   Hyperlipidemia Mother    Diabetes Mother    Hyperlipidemia Father    Diabetes Father    Heart disease Father    Dementia Father    Melanoma Father    Melanoma Sister    Diabetes Sister    Hypertension Maternal Grandmother    Mental illness Maternal Grandmother    Asthma Paternal Grandfather    Heart attack Paternal Grandfather    Arthritis Paternal Grandfather    Social History   Socioeconomic History   Marital status: Married    Spouse name: Jaileigh Weimer   Number of children: Not on file   Years of education: Not on file   Highest education level: Associate degree: academic program  Occupational History   Occupation: Charity fundraiser    Comment: [employer] ([place])  Tobacco Use   Smoking status: Former    Current packs/day: 0.00    Average packs/day: 0.5 packs/day for 5.0 years (2.5 ttl pk-yrs)    Types: Cigarettes    Start date: 05/07/2016    Quit date: 05/07/2021    Years since quitting: 2.5   Smokeless tobacco: Never   Tobacco comments:    Smokes 2-3 cigarettes a day  Vaping Use   Vaping status: Never Used  Substance and Sexual Activity   Alcohol use: Never   Drug use: Never   Sexual activity: Not Currently    Partners: Male    Birth control/protection: Surgical    Comment: Hysterectomy  Other Topics Concern   Not on file  Social History Narrative   Right Handed   1 Can of Soda per Day   Social Drivers of Health   Financial Resource Strain: Low Risk  (11/09/2023)   Overall Financial Resource Strain (CARDIA)    Difficulty of Paying Living Expenses: Not hard at all  Food Insecurity: No Food Insecurity (11/09/2023)   Hunger Vital Sign    Worried About Running Out of Food in the Last Year: Never true    Ran Out of Food in the Last Year: Never true  Transportation Needs: No Transportation Needs (11/09/2023)   PRAPARE - Administrator, Civil Service (Medical): No    Lack of  Transportation (Non-Medical): No  Physical Activity: Insufficiently Active (11/09/2023)   Exercise Vital Sign    Days of Exercise per Week: 3 days  Minutes of Exercise per Session: 20 min  Stress: No Stress Concern Present (11/09/2023)   [employer] of Occupational Health - Occupational Stress Questionnaire    Feeling of Stress: Only a little  Social Connections: Moderately Isolated (11/09/2023)   Social Connection and Isolation Panel    Frequency of Communication with Friends and Family: More than three times a week    Frequency of Social Gatherings with Friends and Family: Once a week    Attends Religious Services: Never    Diplomatic Services operational officer: No    Attends Engineer, structural: Not on file    Marital Status: Married    Objective:  BP 118/70   Temp (!) 83 F (28.3 C)   Ht 5' 3 (1.6 m)   Wt 213 lb (96.6 kg)   SpO2 98%   BMI 37.73 kg/m      11/14/2023    7:41 AM 09/22/2023    2:41 PM 08/08/2023    7:31 AM  BP/Weight  Systolic BP 118 130 108  Diastolic BP 70 74 70  Wt. (Lbs) 213 213 211.8  BMI 37.73 kg/m2 37.73 kg/m2 36.36 kg/m2    Physical Exam  {Perform Simple Foot Exam  Perform Detailed exam:1} {Insert foot Exam (Optional):30965}   Lab Results  Component Value Date   WBC 5.2 08/08/2023   HGB 13.8 08/08/2023   HCT 41.9 08/08/2023   PLT 268 08/08/2023   GLUCOSE 125 (H) 08/08/2023   CHOL 292 (H) 08/08/2023   TRIG 340 (H) 08/08/2023   HDL 39 (L) 08/08/2023   LDLCALC 186 (H) 08/08/2023   ALT 26 08/08/2023   AST 20 08/08/2023   NA 142 08/08/2023   K 4.4 08/08/2023   CL 104 08/08/2023   CREATININE 0.70 08/08/2023   BUN 6 08/08/2023   CO2 21 08/08/2023   TSH 1.670 09/27/2022   HGBA1C 6.5 (H) 08/08/2023      Assessment & Plan:  Type 2 diabetes mellitus with hyperglycemia, without long-term current use of insulin (HCC) -     Hemoglobin A1c  Mixed hyperlipidemia -     CBC with Differential/Platelet -      Comprehensive metabolic panel with GFR -     Lipid panel  GAD (generalized anxiety disorder)  Mild intermittent asthma without complication -     Albuterol  Sulfate HFA; Inhale 2 puffs into the lungs every 6 (six) hours as needed for wheezing or shortness of breath.  Dispense: 18 each; Refill: 2  Vitamin D  deficiency -     VITAMIN D  25 Hydroxy (Vit-D Deficiency, Fractures)     Meds ordered this encounter  Medications   albuterol  (VENTOLIN  HFA) 108 (90 Base) MCG/ACT inhaler    Sig: Inhale 2 puffs into the lungs every 6 (six) hours as needed for wheezing or shortness of breath.    Dispense:  18 each    Refill:  2    Orders Placed This Encounter  Procedures   CBC with Differential/Platelet   Comprehensive metabolic panel with GFR   Hemoglobin A1c   Lipid panel   VITAMIN D  25 Hydroxy (Vit-D Deficiency, Fractures)     Follow-up: Return in about 3 months (around 02/14/2024) for chronic fasting.   I,Marla I Leal-Borjas,acting as a scribe for Abigail Free, MD.,have documented all relevant documentation on the behalf of Abigail Free, MD,as directed by  Abigail Free, MD while in the presence of Abigail Free, MD.   An After Visit Summary was printed and given to  the patient.  Abigail Free, MD [facility] [phone]

## 2023-11-14 ENCOUNTER — Encounter: Payer: Self-pay | Admitting: Family Medicine

## 2023-11-14 ENCOUNTER — Ambulatory Visit (INDEPENDENT_AMBULATORY_CARE_PROVIDER_SITE_OTHER): Admitting: Family Medicine

## 2023-11-14 DIAGNOSIS — F411 Generalized anxiety disorder: Secondary | ICD-10-CM

## 2023-11-14 DIAGNOSIS — E1165 Type 2 diabetes mellitus with hyperglycemia: Secondary | ICD-10-CM

## 2023-11-14 DIAGNOSIS — E7849 Other hyperlipidemia: Secondary | ICD-10-CM | POA: Diagnosis not present

## 2023-11-14 DIAGNOSIS — E1169 Type 2 diabetes mellitus with other specified complication: Secondary | ICD-10-CM | POA: Diagnosis not present

## 2023-11-14 DIAGNOSIS — I2510 Atherosclerotic heart disease of native coronary artery without angina pectoris: Secondary | ICD-10-CM | POA: Diagnosis not present

## 2023-11-14 DIAGNOSIS — E559 Vitamin D deficiency, unspecified: Secondary | ICD-10-CM

## 2023-11-14 DIAGNOSIS — J4520 Mild intermittent asthma, uncomplicated: Secondary | ICD-10-CM

## 2023-11-14 DIAGNOSIS — E782 Mixed hyperlipidemia: Secondary | ICD-10-CM

## 2023-11-14 DIAGNOSIS — Z6837 Body mass index (BMI) 37.0-37.9, adult: Secondary | ICD-10-CM

## 2023-11-14 LAB — COMPREHENSIVE METABOLIC PANEL WITH GFR
ALT: 31 IU/L (ref 0–32)
AST: 27 IU/L (ref 0–40)
Albumin: 4.5 g/dL (ref 3.9–4.9)
Alkaline Phosphatase: 130 IU/L — ABNORMAL HIGH (ref 44–121)
BUN/Creatinine Ratio: 10 (ref 9–23)
BUN: 8 mg/dL (ref 6–24)
Bilirubin Total: 0.3 mg/dL (ref 0.0–1.2)
CO2: 21 mmol/L (ref 20–29)
Calcium: 9.3 mg/dL (ref 8.7–10.2)
Chloride: 102 mmol/L (ref 96–106)
Creatinine, Ser: 0.8 mg/dL (ref 0.57–1.00)
Globulin, Total: 2.1 g/dL (ref 1.5–4.5)
Glucose: 149 mg/dL — ABNORMAL HIGH (ref 70–99)
Potassium: 4.5 mmol/L (ref 3.5–5.2)
Sodium: 141 mmol/L (ref 134–144)
Total Protein: 6.6 g/dL (ref 6.0–8.5)
eGFR: 92 mL/min/{1.73_m2} (ref >59)

## 2023-11-14 LAB — CBC WITH DIFFERENTIAL/PLATELET
Basophils Absolute: 0.1 10*3/uL (ref 0.0–0.2)
Basos: 1 %
EOS (ABSOLUTE): 0.1 10*3/uL (ref 0.0–0.4)
Eos: 2 %
Hematocrit: 43.3 % (ref 34.0–46.6)
Hemoglobin: 13.3 g/dL (ref 11.1–15.9)
Immature Grans (Abs): 0 10*3/uL (ref 0.0–0.1)
Immature Granulocytes: 0 %
Lymphocytes Absolute: 2.1 10*3/uL (ref 0.7–3.1)
Lymphs: 42 %
MCH: 27.4 pg (ref 26.6–33.0)
MCHC: 30.7 g/dL — ABNORMAL LOW (ref 31.5–35.7)
MCV: 89 fL (ref 79–97)
Monocytes Absolute: 0.4 10*3/uL (ref 0.1–0.9)
Monocytes: 8 %
Neutrophils Absolute: 2.3 10*3/uL (ref 1.4–7.0)
Neutrophils: 47 %
Platelets: 282 10*3/uL (ref 150–450)
RBC: 4.86 x10E6/uL (ref 3.77–5.28)
RDW: 12.4 % (ref 11.7–15.4)
WBC: 5 10*3/uL (ref 3.4–10.8)

## 2023-11-14 LAB — LIPID PANEL
Chol/HDL Ratio: 4.1 ratio (ref 0.0–4.4)
Cholesterol, Total: 149 mg/dL (ref 100–199)
HDL: 36 mg/dL — ABNORMAL LOW (ref >39)
LDL Chol Calc (NIH): 90 mg/dL (ref 0–99)
Triglycerides: 127 mg/dL (ref 0–149)
VLDL Cholesterol Cal: 23 mg/dL (ref 5–40)

## 2023-11-14 LAB — HEMOGLOBIN A1C
Est. average glucose Bld gHb Est-mCnc: 154 mg/dL
Hgb A1c MFr Bld: 7 % — ABNORMAL HIGH (ref 4.8–5.6)

## 2023-11-14 MED ORDER — ALBUTEROL SULFATE HFA 108 (90 BASE) MCG/ACT IN AERS: 2.0000 | INHALATION_SPRAY | Freq: Four times a day (QID) | RESPIRATORY_TRACT | 2 refills | Status: DC | PRN

## 2023-11-15 LAB — VITAMIN D 25 HYDROXY (VIT D DEFICIENCY, FRACTURES): Vit D, 25-Hydroxy: 45.1 ng/mL (ref 30.0–100.0)

## 2023-11-16 ENCOUNTER — Ambulatory Visit: Payer: Self-pay | Admitting: Family Medicine

## 2023-11-16 DIAGNOSIS — Z6837 Body mass index (BMI) 37.0-37.9, adult: Secondary | ICD-10-CM | POA: Insufficient documentation

## 2023-11-16 DIAGNOSIS — I2510 Atherosclerotic heart disease of native coronary artery without angina pectoris: Secondary | ICD-10-CM | POA: Insufficient documentation

## 2023-11-16 NOTE — Assessment & Plan Note (Signed)
 Check labs

## 2023-11-16 NOTE — Assessment & Plan Note (Deleted)
 Not at goal of LDL less than 55 due to noncritical cad.  Increase lipitor to 80 mg before bed.  Recommend continue to work on eating healthy diet and exercise. Checking labs today

## 2023-11-16 NOTE — Assessment & Plan Note (Addendum)
 The current medical regimen is effective;  continue present plan and medications. Albuterol  hfa 2 puffs four times a day as needed.

## 2023-11-16 NOTE — Assessment & Plan Note (Signed)
 Recommend continue to work on eating healthy diet and exercise.

## 2023-11-16 NOTE — Assessment & Plan Note (Signed)
The current medical regimen is effective;  continue present plan and medications.  Continue Prozac 60 mg daily.  Ativan 0.5 mg twice daily Sees psychiatry.

## 2023-11-16 NOTE — Assessment & Plan Note (Signed)
 Not at goal of LDL less than 55 due to noncritical cad.  Increase lipitor to 80 mg before bed. Continue aspirin  81 mg daily.  Recommend continue to work on eating healthy diet and exercise. Checking labs today

## 2023-11-16 NOTE — Assessment & Plan Note (Addendum)
Control: Well controlled. Recommend check sugars fasting daily. Recommend check feet daily. Recommend annual eye exams. Medicines: none. Continue to work on eating a healthy diet and exercise.  Labs drawn today.    

## 2023-11-16 NOTE — Assessment & Plan Note (Signed)
 Not at goal of LDL less than 55 due to noncritical cad.  Increase lipitor to 80 mg before bed.  Recommend continue to work on eating healthy diet and exercise. Checking labs today

## 2024-02-23 NOTE — Progress Notes (Unsigned)
 Subjective:  Patient ID: Kimberly Cabrera, female    DOB: [birth year]  Age: 48 y.o. MRN: [id number]  No chief complaint on file.   HPI: Discussed the use of AI scribe software for clinical note transcription with the patient, who gave verbal consent to proceed.  History of Present Illness        11/14/2023    7:45 AM 08/08/2023    7:37 AM 04/28/2023    7:30 AM 12/30/2022    7:30 AM 10/01/2022   11:43 AM  Depression screen PHQ 2/9  Decreased Interest 0 2 0 0 0  Down, Depressed, Hopeless 0 1 0 0 0  PHQ - 2 Score 0 3 0 0 0  Altered sleeping 1 2 1 1 1   Tired, decreased energy 1 2 0 0 1  Change in appetite 1 2 0 0 0  Feeling bad or failure about yourself  0 0 0 0 0  Trouble concentrating 0 1 0 0 0  Moving slowly or fidgety/restless 0 0 0 0 0  Suicidal thoughts 0 0 0 0 0  PHQ-9 Score 3 10 1 1 2   Difficult doing work/chores Not difficult at all Somewhat difficult Not difficult at all Not difficult at all Not difficult at all        08/08/2023    7:37 AM  Fall Risk   Falls in the past year? 0  Number falls in past yr: 0  Injury with Fall? 0  Risk for fall due to : No Fall Risks  Follow up Falls evaluation completed    Patient Care Team: Sherre Clapper, MD as PCP - General (Family Medicine) Sater, Charlie LABOR, MD (Neurology)   Review of Systems  Current Outpatient Medications on File Prior to Visit  Medication Sig Dispense Refill   albuterol  (VENTOLIN  HFA) 108 (90 Base) MCG/ACT inhaler Inhale 2 puffs into the lungs every 6 (six) hours as needed for wheezing or shortness of breath. 18 each 2   aspirin  EC 81 MG tablet Take 1 tablet (81 mg total) by mouth daily. Swallow whole.     atorvastatin  (LIPITOR) 40 MG tablet Take 40 mg by mouth daily.     FLUoxetine (PROZAC) 20 MG capsule Take 20 mg by mouth daily. Take with Prozac 40 mg to equal 60 mg     FLUoxetine (PROZAC) 40 MG capsule Take 40 mg by mouth See admin instructions. Take with 20 mg for a total of 60 mg daily     LORazepam  (ATIVAN) 0.5 MG tablet Take 0.5 mg by mouth 2 (two) times daily.     nitroGLYCERIN  (NITROSTAT ) 0.4 MG SL tablet Place 1 tablet (0.4 mg total) under the tongue every 5 (five) minutes as needed for chest pain. 25 tablet 0   No current facility-administered medications on file prior to visit.   Past Medical History:  Diagnosis Date   Anxiety    Asthma    seasonal asthma   Depression    no current problem   GERD (gastroesophageal reflux disease)    occasional - otc med prn   Headache    History of kidney stones    surgery to remove   Hyperlipidemia    no meds, diet controlled   Past Surgical History:  Procedure Laterality Date   abdominal contouring with liposuction and skin resection.     08/2022   ABDOMINAL HYSTERECTOMY     48 yo. Total.   APPENDECTOMY     ECTOPIC PREGNANCY SURGERY N/A  laparoscopic   LIPOSUCTION  09/09/2022   RADIOLOGY WITH ANESTHESIA N/A 07/17/2021   Procedure: MRI WITH ANESTHESIA BRAIN WITH AND WITHOUT CONTRAST, MRI CERVICAL SPINE WITH AND WITHOUT CONTRAST;  Surgeon: Radiologist, Medication, MD;  Location: [facility] OR;  Service: Radiology;  Laterality: N/A;   removal kidney stone     TONSILLECTOMY     WISDOM TOOTH EXTRACTION      Family History  Problem Relation Age of Onset   Hyperlipidemia Mother    Diabetes Mother    Hyperlipidemia Father    Diabetes Father    Heart disease Father    Dementia Father    Melanoma Father    Melanoma Sister    Diabetes Sister    Hypertension Maternal Grandmother    Mental illness Maternal Grandmother    Asthma Paternal Grandfather    Heart attack Paternal Grandfather    Arthritis Paternal Grandfather    Social History   Socioeconomic History   Marital status: Married    Spouse name: Treena Cosman   Number of children: Not on file   Years of education: Not on file   Highest education level: Associate degree: academic program  Occupational History   Occupation: Charity fundraiser    Comment: [employer] ([place])  Tobacco  Use   Smoking status: Former    Current packs/day: 0.00    Average packs/day: 0.5 packs/day for 5.0 years (2.5 ttl pk-yrs)    Types: Cigarettes    Start date: 05/07/2016    Quit date: 05/07/2021    Years since quitting: 2.8   Smokeless tobacco: Never   Tobacco comments:    Smokes 2-3 cigarettes a day  Vaping Use   Vaping status: Never Used  Substance and Sexual Activity   Alcohol use: Never   Drug use: Never   Sexual activity: Not Currently    Partners: Male    Birth control/protection: Surgical    Comment: Hysterectomy  Other Topics Concern   Not on file  Social History Narrative   Right Handed   1 Can of Soda per Day   Social Drivers of Health   Financial Resource Strain: Low Risk  (11/09/2023)   Overall Financial Resource Strain (CARDIA)    Difficulty of Paying Living Expenses: Not hard at all  Food Insecurity: No Food Insecurity (11/09/2023)   Hunger Vital Sign    Worried About Running Out of Food in the Last Year: Never true    Ran Out of Food in the Last Year: Never true  Transportation Needs: No Transportation Needs (11/09/2023)   PRAPARE - Administrator, Civil Service (Medical): No    Lack of Transportation (Non-Medical): No  Physical Activity: Insufficiently Active (11/09/2023)   Exercise Vital Sign    Days of Exercise per Week: 3 days    Minutes of Exercise per Session: 20 min  Stress: No Stress Concern Present (11/09/2023)   [employer] of Occupational Health - Occupational Stress Questionnaire    Feeling of Stress: Only a little  Social Connections: Moderately Isolated (11/09/2023)   Social Connection and Isolation Panel    Frequency of Communication with Friends and Family: More than three times a week    Frequency of Social Gatherings with Friends and Family: Once a week    Attends Religious Services: Never    Database administrator or Organizations: No    Attends Engineer, structural: Not on file    Marital Status: Married     Objective:  There were no vitals  taken for this visit.     11/14/2023    7:41 AM 09/22/2023    2:41 PM 08/08/2023    7:31 AM  BP/Weight  Systolic BP 118 130 108  Diastolic BP 70 74 70  Wt. (Lbs) 213 213 211.8  BMI 37.73 kg/m2 37.73 kg/m2 36.36 kg/m2    Physical Exam Vitals reviewed.  Constitutional:      Appearance: Normal appearance. She is obese.  Neck:     Vascular: No carotid bruit.  Cardiovascular:     Rate and Rhythm: Normal rate and regular rhythm.     Heart sounds: Normal heart sounds.  Pulmonary:     Effort: Pulmonary effort is normal. No respiratory distress.     Breath sounds: Normal breath sounds.  Abdominal:     General: Abdomen is flat. Bowel sounds are normal.     Palpations: Abdomen is soft.     Tenderness: There is no abdominal tenderness.  Neurological:     Mental Status: She is alert and oriented to person, place, and time.  Psychiatric:        Mood and Affect: Mood normal.        Behavior: Behavior normal.     {Perform Simple Foot Exam  Perform Detailed exam:1} {Insert foot Exam (Optional):30965}   Lab Results  Component Value Date   WBC 5.0 11/14/2023   HGB 13.3 11/14/2023   HCT 43.3 11/14/2023   PLT 282 11/14/2023   GLUCOSE 149 (H) 11/14/2023   CHOL 149 11/14/2023   TRIG 127 11/14/2023   HDL 36 (L) 11/14/2023   LDLCALC 90 11/14/2023   ALT 31 11/14/2023   AST 27 11/14/2023   NA 141 11/14/2023   K 4.5 11/14/2023   CL 102 11/14/2023   CREATININE 0.80 11/14/2023   BUN 8 11/14/2023   CO2 21 11/14/2023   TSH 1.670 09/27/2022   HGBA1C 7.0 (H) 11/14/2023    Results for orders placed or performed in visit on 11/14/23  VITAMIN D  25 Hydroxy (Vit-D Deficiency, Fractures)   Collection Time: 11/14/23  8:00 AM  Result Value Ref Range   Vit D, 25-Hydroxy 45.1 30.0 - 100.0 ng/mL  CBC with Differential/Platelet   Collection Time: 11/14/23  8:03 AM  Result Value Ref Range   WBC 5.0 3.4 - 10.8 x10E3/uL   RBC 4.86 3.77 - 5.28 x10E6/uL    Hemoglobin 13.3 11.1 - 15.9 g/dL   Hematocrit 56.6 65.9 - 46.6 %   MCV 89 79 - 97 fL   MCH 27.4 26.6 - 33.0 pg   MCHC 30.7 (L) 31.5 - 35.7 g/dL   RDW 87.5 88.2 - 84.5 %   Platelets 282 150 - 450 x10E3/uL   Neutrophils 47 Not Estab. %   Lymphs 42 Not Estab. %   Monocytes 8 Not Estab. %   Eos 2 Not Estab. %   Basos 1 Not Estab. %   Neutrophils Absolute 2.3 1.4 - 7.0 x10E3/uL   Lymphocytes Absolute 2.1 0.7 - 3.1 x10E3/uL   Monocytes Absolute 0.4 0.1 - 0.9 x10E3/uL   EOS (ABSOLUTE) 0.1 0.0 - 0.4 x10E3/uL   Basophils Absolute 0.1 0.0 - 0.2 x10E3/uL   Immature Granulocytes 0 Not Estab. %   Immature Grans (Abs) 0.0 0.0 - 0.1 x10E3/uL  Comprehensive metabolic panel with GFR   Collection Time: 11/14/23  8:03 AM  Result Value Ref Range   Glucose 149 (H) 70 - 99 mg/dL   BUN 8 6 - 24 mg/dL   Creatinine, Ser 9.19  0.57 - 1.00 mg/dL   eGFR 92 >40 fO/fpw/8.26   BUN/Creatinine Ratio 10 9 - 23   Sodium 141 134 - 144 mmol/L   Potassium 4.5 3.5 - 5.2 mmol/L   Chloride 102 96 - 106 mmol/L   CO2 21 20 - 29 mmol/L   Calcium  9.3 8.7 - 10.2 mg/dL   Total Protein 6.6 6.0 - 8.5 g/dL   Albumin 4.5 3.9 - 4.9 g/dL   Globulin, Total 2.1 1.5 - 4.5 g/dL   Bilirubin Total 0.3 0.0 - 1.2 mg/dL   Alkaline Phosphatase 130 (H) 44 - 121 IU/L   AST 27 0 - 40 IU/L   ALT 31 0 - 32 IU/L  Hemoglobin A1c   Collection Time: 11/14/23  8:03 AM  Result Value Ref Range   Hgb A1c MFr Bld 7.0 (H) 4.8 - 5.6 %   Est. average glucose Bld gHb Est-mCnc 154 mg/dL  Lipid panel   Collection Time: 11/14/23  8:03 AM  Result Value Ref Range   Cholesterol, Total 149 100 - 199 mg/dL   Triglycerides 872 0 - 149 mg/dL   HDL 36 (L) >60 mg/dL   VLDL Cholesterol Cal 23 5 - 40 mg/dL   LDL Chol Calc (NIH) 90 0 - 99 mg/dL   Chol/HDL Ratio 4.1 0.0 - 4.4 ratio  .  Assessment & Plan:   Assessment & Plan Type 2 diabetes mellitus associated with morbid obesity (HCC)     Familial hyperlipidemia     Severe obesity with body mass  index (BMI) of 36.0 to 36.9 with serious comorbidity (HCC)       There is no height or weight on file to calculate BMI.  Assessment and Plan Assessment & Plan      No orders of the defined types were placed in this encounter.   No orders of the defined types were placed in this encounter.      Follow-up: No follow-ups on file.  An After Visit Summary was printed and given to the patient.  Abigail Free, MD [facility] [phone]

## 2024-02-23 NOTE — Assessment & Plan Note (Signed)
 Kimberly Cabrera

## 2024-02-25 ENCOUNTER — Encounter: Payer: Self-pay | Admitting: Family Medicine

## 2024-02-25 ENCOUNTER — Ambulatory Visit: Admitting: Family Medicine

## 2024-02-25 ENCOUNTER — Ambulatory Visit: Payer: Self-pay | Admitting: Family Medicine

## 2024-02-25 DIAGNOSIS — E1169 Type 2 diabetes mellitus with other specified complication: Secondary | ICD-10-CM | POA: Diagnosis not present

## 2024-02-25 DIAGNOSIS — Z6836 Body mass index (BMI) 36.0-36.9, adult: Secondary | ICD-10-CM

## 2024-02-25 DIAGNOSIS — F4310 Post-traumatic stress disorder, unspecified: Secondary | ICD-10-CM | POA: Insufficient documentation

## 2024-02-25 DIAGNOSIS — Z1231 Encounter for screening mammogram for malignant neoplasm of breast: Secondary | ICD-10-CM

## 2024-02-25 DIAGNOSIS — J4520 Mild intermittent asthma, uncomplicated: Secondary | ICD-10-CM | POA: Diagnosis not present

## 2024-02-25 DIAGNOSIS — F331 Major depressive disorder, recurrent, moderate: Secondary | ICD-10-CM | POA: Insufficient documentation

## 2024-02-25 DIAGNOSIS — E7849 Other hyperlipidemia: Secondary | ICD-10-CM | POA: Diagnosis not present

## 2024-02-25 DIAGNOSIS — F411 Generalized anxiety disorder: Secondary | ICD-10-CM

## 2024-02-25 LAB — POCT GLYCOSYLATED HEMOGLOBIN (HGB A1C): HbA1c POC (<> result, manual entry): 6.9 % (ref 4.0–5.6)

## 2024-02-25 LAB — POCT LIPID PANEL
HDL: 38
LDL: 211
Non-HDL: 244
TC: 281
TRG: 162

## 2024-02-25 MED ORDER — ATORVASTATIN CALCIUM 20 MG PO TABS: 20.0000 mg | ORAL_TABLET | Freq: Every day | ORAL | 1 refills | Status: AC

## 2024-02-25 MED ORDER — AIRSUPRA 90-80 MCG/ACT IN AERO: 2.0000 | INHALATION_SPRAY | Freq: Four times a day (QID) | RESPIRATORY_TRACT | 5 refills | Status: AC | PRN

## 2024-02-25 NOTE — Assessment & Plan Note (Addendum)
 PTSD exacerbation with significant anxiety symptoms. Benefiting from IOP at [facility]. Medications managed by psychiatrist Sarah Tatko. Considering EMDR therapy. - Continue Valium, prazosin, hydroxyzine, and propranolol. - Continue Intensive Outpatient Program at [facility]. - Consider EMDR therapy in the future.

## 2024-02-25 NOTE — Patient Instructions (Addendum)
  VISIT SUMMARY: During your visit, we discussed the exacerbation of your PTSD symptoms and your current treatment plan. We also addressed your mild intermittent asthma, hyperlipidemia, morbid obesity, and type 2 diabetes mellitus. Additionally, we reviewed your general health maintenance needs.  YOUR PLAN: POST-TRAUMATIC STRESS DISORDER AND ANXIETY DISORDER: You have experienced a significant increase in PTSD symptoms since early August, following a recent trigger. You are currently benefiting from an Intensive Outpatient Program (IOP) and are on several medications for PTSD. -Continue taking Valium, prazosin, hydroxyzine, and propranolol as prescribed. -Continue participating in the Intensive Outpatient Program at [facility]. -Consider EMDR therapy in the future.  MILD INTERMITTENT ASTHMA: Your mild intermittent asthma has been exacerbated by seasonal changes. -You have been prescribed an Airsupra inhaler. STOP ALBUTEROL .  Please use it as directed and rinse your mouth after each use.  HYPERLIPIDEMIA: You are managing hyperlipidemia with atorvastatin  but have experienced brain fog, leading to taking the medication every other day. -We will perform blood work to check your cholesterol levels.  MORBID OBESITY: You have made recent progress with weight loss due to healthier eating habits and plan to start a walking program. -Continue your healthy eating habits. -Consider starting a walking program when you feel ready.  TYPE 2 DIABETES MELLITUS: You have type 2 diabetes mellitus and are currently not on medication. -We will continue to monitor your condition and discuss medication options if necessary.  GENERAL HEALTH MAINTENANCE: Routine health maintenance was discussed, and you declined the flu shot but prefer to have your mammogram at [facility]. -We will send the mammogram order to [facility].                       Contains text generated by [employer].                                  Contains text generated by [employer].

## 2024-02-25 NOTE — Assessment & Plan Note (Addendum)
 Mild intermittent asthma exacerbated by seasonal changes. Discussed Airsupra for improved rescue efficacy. - Prescribe Airsupra inhaler with a coupon for free access. - Instruct to rinse mouth after use of Airsupra inhaler. Orders:   Albuterol -Budesonide (AIRSUPRA) 90-80 MCG/ACT AERO; Inhale 2 puffs into the lungs 4 (four) times daily as needed (wheezing).

## 2024-02-26 LAB — CBC WITH DIFFERENTIAL/PLATELET
Basophils Absolute: 0.1 x10E3/uL (ref 0.0–0.2)
Basos: 1 %
EOS (ABSOLUTE): 0.2 x10E3/uL (ref 0.0–0.4)
Eos: 3 %
Hematocrit: 44.8 % (ref 34.0–46.6)
Hemoglobin: 14.3 g/dL (ref 11.1–15.9)
Immature Grans (Abs): 0 x10E3/uL (ref 0.0–0.1)
Immature Granulocytes: 0 %
Lymphocytes Absolute: 2.4 x10E3/uL (ref 0.7–3.1)
Lymphs: 49 %
MCH: 28.4 pg (ref 26.6–33.0)
MCHC: 31.9 g/dL (ref 31.5–35.7)
MCV: 89 fL (ref 79–97)
Monocytes Absolute: 0.4 x10E3/uL (ref 0.1–0.9)
Monocytes: 7 %
Neutrophils Absolute: 2 x10E3/uL (ref 1.4–7.0)
Neutrophils: 40 %
Platelets: 282 x10E3/uL (ref 150–450)
RBC: 5.04 x10E6/uL (ref 3.77–5.28)
RDW: 13.1 % (ref 11.7–15.4)
WBC: 5 x10E3/uL (ref 3.4–10.8)

## 2024-02-26 LAB — COMPREHENSIVE METABOLIC PANEL WITH GFR
ALT: 42 IU/L — ABNORMAL HIGH (ref 0–32)
AST: 29 IU/L (ref 0–40)
Albumin: 4.6 g/dL (ref 3.9–4.9)
Alkaline Phosphatase: 97 IU/L (ref 41–116)
BUN/Creatinine Ratio: 9 (ref 9–23)
BUN: 8 mg/dL (ref 6–24)
Bilirubin Total: 0.4 mg/dL (ref 0.0–1.2)
CO2: 20 mmol/L (ref 20–29)
Calcium: 9.5 mg/dL (ref 8.7–10.2)
Chloride: 103 mmol/L (ref 96–106)
Creatinine, Ser: 0.88 mg/dL (ref 0.57–1.00)
Globulin, Total: 2.4 g/dL (ref 1.5–4.5)
Glucose: 118 mg/dL — ABNORMAL HIGH (ref 70–99)
Potassium: 4.3 mmol/L (ref 3.5–5.2)
Sodium: 142 mmol/L (ref 134–144)
Total Protein: 7 g/dL (ref 6.0–8.5)
eGFR: 82 mL/min/1.73 (ref >59)

## 2024-03-10 ENCOUNTER — Ambulatory Visit: Admitting: Neurology

## 2024-04-13 ENCOUNTER — Encounter: Payer: Self-pay | Admitting: Family Medicine

## 2024-04-13 ENCOUNTER — Other Ambulatory Visit: Payer: Self-pay

## 2024-04-13 DIAGNOSIS — Z1231 Encounter for screening mammogram for malignant neoplasm of breast: Secondary | ICD-10-CM

## 2024-05-10 ENCOUNTER — Encounter (HOSPITAL_BASED_OUTPATIENT_CLINIC_OR_DEPARTMENT_OTHER): Payer: Self-pay | Admitting: Radiology

## 2024-05-10 ENCOUNTER — Ambulatory Visit (INDEPENDENT_AMBULATORY_CARE_PROVIDER_SITE_OTHER)
Admission: RE | Admit: 2024-05-10 | Discharge: 2024-05-10 | Disposition: A | Discharge status: Home / Self Care | Source: Ambulatory Visit | Attending: Family Medicine | Admitting: Family Medicine

## 2024-05-10 DIAGNOSIS — Z1231 Encounter for screening mammogram for malignant neoplasm of breast: Secondary | ICD-10-CM

## 2024-05-13 ENCOUNTER — Ambulatory Visit: Payer: Self-pay | Admitting: Family Medicine

## 2024-06-09 ENCOUNTER — Ambulatory Visit: Admitting: Family Medicine

## 2024-07-13 ENCOUNTER — Ambulatory Visit: Admitting: Family Medicine

## 2024-09-20 ENCOUNTER — Ambulatory Visit: Admitting: Neurology
